# Patient Record
Sex: Female | Born: 1940 | Race: Black or African American | Hispanic: No | Marital: Married | Smoking: Former smoker
Health system: Southern US, Community
[De-identification: ages and names within clinical notes are randomized; demographics above are authoritative.]

## PROBLEM LIST (undated history)

## (undated) DIAGNOSIS — G2581 Restless legs syndrome: Secondary | ICD-10-CM

## (undated) DIAGNOSIS — R42 Dizziness and giddiness: Secondary | ICD-10-CM

## (undated) DIAGNOSIS — I10 Essential (primary) hypertension: Secondary | ICD-10-CM

## (undated) HISTORY — PX: TONSILLECTOMY: SUR1361

## (undated) HISTORY — DX: Restless legs syndrome: G25.81

## (undated) HISTORY — DX: Essential (primary) hypertension: I10

## (undated) HISTORY — DX: Dizziness and giddiness: R42

---

## 1997-11-12 ENCOUNTER — Other Ambulatory Visit: Admission: RE | Admit: 1997-11-12 | Discharge: 1997-11-12 | Payer: Self-pay | Admitting: Obstetrics & Gynecology

## 1998-11-26 ENCOUNTER — Other Ambulatory Visit: Admission: RE | Admit: 1998-11-26 | Discharge: 1998-11-26 | Payer: Self-pay | Admitting: Obstetrics & Gynecology

## 1998-12-18 ENCOUNTER — Other Ambulatory Visit: Admission: RE | Admit: 1998-12-18 | Discharge: 1998-12-18 | Payer: Self-pay | Admitting: Obstetrics & Gynecology

## 1998-12-18 ENCOUNTER — Encounter (INDEPENDENT_AMBULATORY_CARE_PROVIDER_SITE_OTHER): Payer: Self-pay | Admitting: Specialist

## 2000-02-02 ENCOUNTER — Encounter: Payer: Self-pay | Admitting: General Practice

## 2000-02-02 ENCOUNTER — Encounter: Admission: RE | Admit: 2000-02-02 | Discharge: 2000-02-02 | Payer: Self-pay | Admitting: General Practice

## 2000-07-19 ENCOUNTER — Other Ambulatory Visit: Admission: RE | Admit: 2000-07-19 | Discharge: 2000-07-19 | Payer: Self-pay | Admitting: Obstetrics and Gynecology

## 2000-11-29 ENCOUNTER — Ambulatory Visit (HOSPITAL_COMMUNITY): Admission: RE | Admit: 2000-11-29 | Discharge: 2000-11-29 | Payer: Self-pay | Admitting: Gastroenterology

## 2001-02-25 ENCOUNTER — Emergency Department (HOSPITAL_COMMUNITY): Admission: EM | Admit: 2001-02-25 | Discharge: 2001-02-25 | Payer: Self-pay

## 2001-06-15 ENCOUNTER — Encounter: Admission: RE | Admit: 2001-06-15 | Discharge: 2001-06-15 | Payer: Self-pay | Admitting: General Practice

## 2001-06-15 ENCOUNTER — Encounter: Payer: Self-pay | Admitting: General Practice

## 2001-10-16 ENCOUNTER — Emergency Department (HOSPITAL_COMMUNITY): Admission: EM | Admit: 2001-10-16 | Discharge: 2001-10-16 | Payer: Self-pay | Admitting: Emergency Medicine

## 2002-10-09 ENCOUNTER — Other Ambulatory Visit: Admission: RE | Admit: 2002-10-09 | Discharge: 2002-10-09 | Payer: Self-pay | Admitting: Obstetrics and Gynecology

## 2004-07-29 ENCOUNTER — Other Ambulatory Visit: Admission: RE | Admit: 2004-07-29 | Discharge: 2004-07-29 | Payer: Self-pay | Admitting: Obstetrics and Gynecology

## 2004-07-30 ENCOUNTER — Ambulatory Visit (HOSPITAL_COMMUNITY): Admission: RE | Admit: 2004-07-30 | Discharge: 2004-07-30 | Payer: Self-pay | Admitting: Obstetrics and Gynecology

## 2005-10-03 ENCOUNTER — Other Ambulatory Visit: Admission: RE | Admit: 2005-10-03 | Discharge: 2005-10-03 | Payer: Self-pay | Admitting: Obstetrics and Gynecology

## 2005-10-11 ENCOUNTER — Encounter: Admission: RE | Admit: 2005-10-11 | Discharge: 2005-10-11 | Payer: Self-pay | Admitting: Obstetrics and Gynecology

## 2007-10-10 ENCOUNTER — Encounter: Admission: RE | Admit: 2007-10-10 | Discharge: 2007-10-10 | Payer: Self-pay | Admitting: Obstetrics and Gynecology

## 2009-07-09 ENCOUNTER — Other Ambulatory Visit: Admission: RE | Admit: 2009-07-09 | Discharge: 2009-07-09 | Payer: Self-pay | Admitting: Family Medicine

## 2010-05-09 ENCOUNTER — Encounter: Payer: Self-pay | Admitting: Obstetrics and Gynecology

## 2010-09-03 NOTE — Procedures (Signed)
[place]  Patient:    Ashley Rogers, Ashley Rogers                     MRN: [id number] Proc. Date: 11/29/00 Adm. Date:  [id number] Attending:  Charna Elizabeth CC:         Janine Limbo, M.D.   Procedure Report  DATE OF BIRTH:  Jun 20, 1940.  PROCEDURE:  Screening flexible sigmoidoscopy.  ENDOSCOPIST:  Anselmo Rod, M.D.  INSTRUMENT USED:  Olympus video colonoscope.  INDICATION FOR PROCEDURE:  A 70 year old African-American female undergoing screening flexible sigmoidoscopy to rule out colonic polyps.  PREPROCEDURE PREPARATION:  Informed consent was procured from the patient. The patient was fasted for eight hours prior to the procedure and prepped with a bottle of Fleets Phospho-Soda the night prior to the procedure.  PREPROCEDURE PHYSICAL:  VITAL SIGNS:  The patient had stable vital signs.  NECK:  Supple.  CHEST:  Clear to auscultation.  S1, S2 regular.  ABDOMEN:  Soft with normal bowel sounds.  DESCRIPTION OF PROCEDURE:  The patient was placed in the left lateral decubitus position.  No sedation was used.  Once the patient was adequately positioned, the pediatric Olympus colonoscope was advanced from the rectum to 90 cm without difficulty.  Except for a few early left-sided diverticula, no other abnormalities were seen.  The patient tolerated the procedure well without complications.  IMPRESSION:  Few early left-sided diverticula, otherwise normal flexible sigmoidoscopy up to 90 cm.  RECOMMENDATIONS: 1. A high-fiber diet has been recommended for the patient. 2. Repeat colorectal cancer screening is recommended in the next five years    unless the patient were to develop any abnormal symptoms in the interim. DD:  11/29/00 TD:  11/29/00 Job: [id number]

## 2011-03-20 ENCOUNTER — Ambulatory Visit (HOSPITAL_COMMUNITY)
Admission: RE | Admit: 2011-03-20 | Discharge: 2011-03-20 | Disposition: A | Discharge status: Home / Self Care | Payer: Medicare Other | Source: Ambulatory Visit | Attending: Internal Medicine | Admitting: Internal Medicine

## 2011-03-20 ENCOUNTER — Other Ambulatory Visit: Payer: Self-pay | Admitting: Internal Medicine

## 2011-03-20 DIAGNOSIS — R42 Dizziness and giddiness: Secondary | ICD-10-CM | POA: Insufficient documentation

## 2011-03-20 DIAGNOSIS — G9389 Other specified disorders of brain: Secondary | ICD-10-CM | POA: Insufficient documentation

## 2011-03-21 ENCOUNTER — Other Ambulatory Visit: Payer: Self-pay | Admitting: Family Medicine

## 2011-03-21 ENCOUNTER — Other Ambulatory Visit: Payer: Self-pay | Admitting: *Deleted

## 2011-03-21 ENCOUNTER — Ambulatory Visit
Admission: RE | Admit: 2011-03-21 | Discharge: 2011-03-21 | Disposition: A | Discharge status: Home / Self Care | Payer: BC Managed Care – PPO | Source: Ambulatory Visit | Attending: Family Medicine | Admitting: Family Medicine

## 2011-03-21 DIAGNOSIS — R270 Ataxia, unspecified: Secondary | ICD-10-CM

## 2011-03-21 DIAGNOSIS — R42 Dizziness and giddiness: Secondary | ICD-10-CM

## 2011-03-21 MED ORDER — GADOBENATE DIMEGLUMINE 529 MG/ML IV SOLN
15.0000 mL | Freq: Once | INTRAVENOUS | Status: AC | PRN
  Administered 2011-03-21: 15 mL via INTRAVENOUS

## 2012-05-24 ENCOUNTER — Emergency Department (HOSPITAL_COMMUNITY): Payer: BC Managed Care – PPO

## 2012-05-24 ENCOUNTER — Emergency Department (HOSPITAL_COMMUNITY)
Admission: EM | Admit: 2012-05-24 | Discharge: 2012-05-25 | Disposition: A | Discharge status: Home / Self Care | Payer: BC Managed Care – PPO | Attending: Emergency Medicine | Admitting: Emergency Medicine

## 2012-05-24 DIAGNOSIS — Y9389 Activity, other specified: Secondary | ICD-10-CM | POA: Insufficient documentation

## 2012-05-24 DIAGNOSIS — Y92410 Unspecified street and highway as the place of occurrence of the external cause: Secondary | ICD-10-CM | POA: Insufficient documentation

## 2012-05-24 DIAGNOSIS — S298XXA Other specified injuries of thorax, initial encounter: Secondary | ICD-10-CM | POA: Insufficient documentation

## 2012-05-24 DIAGNOSIS — I10 Essential (primary) hypertension: Secondary | ICD-10-CM | POA: Insufficient documentation

## 2012-05-24 MED ORDER — HYDROCODONE-ACETAMINOPHEN 5-325 MG PO TABS: 1.0000 | ORAL_TABLET | Freq: Four times a day (QID) | ORAL | Status: DC | PRN

## 2012-05-24 NOTE — ED Notes (Signed)
Patient transported to X-ray 
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
## 2014-08-18 ENCOUNTER — Other Ambulatory Visit (HOSPITAL_COMMUNITY)
Admission: RE | Admit: 2014-08-18 | Discharge: 2014-08-18 | Disposition: A | Discharge status: Home / Self Care | Payer: BC Managed Care – PPO | Source: Ambulatory Visit | Attending: Family Medicine | Admitting: Family Medicine

## 2014-08-18 ENCOUNTER — Other Ambulatory Visit: Payer: Self-pay | Admitting: Family Medicine

## 2014-08-18 DIAGNOSIS — Z1151 Encounter for screening for human papillomavirus (HPV): Secondary | ICD-10-CM | POA: Diagnosis present

## 2014-08-18 DIAGNOSIS — Z124 Encounter for screening for malignant neoplasm of cervix: Secondary | ICD-10-CM | POA: Insufficient documentation

## 2014-08-20 LAB — CYTOLOGY - PAP

## 2015-02-26 ENCOUNTER — Telehealth: Payer: Self-pay | Admitting: *Deleted

## 2015-02-26 NOTE — Telephone Encounter (Signed)
Pt states she is scheduled for a procedure tomorrow and would like to know the prep, prognosis, and the outcome.  Pt states she decided that depending on the doctor's assessment it may be just a consultation tomorrow, and thanked me for my call.

## 2015-02-27 ENCOUNTER — Encounter: Payer: Self-pay | Admitting: Podiatry

## 2015-02-27 ENCOUNTER — Ambulatory Visit (INDEPENDENT_AMBULATORY_CARE_PROVIDER_SITE_OTHER): Payer: BC Managed Care – PPO | Admitting: Podiatry

## 2015-02-27 VITALS — BP 153/86 | HR 80 | Resp 16

## 2015-02-27 DIAGNOSIS — L600 Ingrowing nail: Secondary | ICD-10-CM | POA: Diagnosis not present

## 2015-02-27 NOTE — Patient Instructions (Signed)

## 2015-02-27 NOTE — Progress Notes (Signed)
   Subjective:    Patient ID: Ashley Rogers, female    DOB: 06/14/1940, 74 y.o.   MRN: [id number]  HPI Comments: "I have a toenail that bothers me"  Patient presents with: Nail Problem: 1st toenail right - thick and discolored, tender at times, hard to cut, starting to curve to the side-tries to keep clipped down.       Review of Systems  Skin:       Change in nails  All other systems reviewed and are negative.      Objective:   Physical Exam        Assessment & Plan:

## 2015-03-01 NOTE — Progress Notes (Signed)
Subjective:     Patient ID: Ashley Rogers, female   DOB: 01/15/1941, 74 y.o.   MRN: [id number]  HPI patient states she's having continued problems with her right big toenail and it is getting increasingly thick increasingly damaged and increasingly painful over the last year   Review of Systems  All other systems reviewed and are negative.      Objective:   Physical Exam  Constitutional: She is oriented to person, place, and time.  Cardiovascular: Intact distal pulses.   Musculoskeletal: Normal range of motion.  Neurological: She is oriented to person, place, and time.  Skin: Skin is warm.  Nursing note and vitals reviewed.  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 I do think given the amount of thickness them out a damaged length of time the problem is been there and pain it would be best removed and permanently. I educated patient on the condition and the treatment and patient wants to do this but cannot do this today. She will be scheduled when it works well for her schedule for nail removal and I did go over risk

## 2015-03-03 ENCOUNTER — Telehealth: Payer: Self-pay | Admitting: *Deleted

## 2015-03-03 NOTE — Telephone Encounter (Signed)
Called patient at [phone] (Cell #) to check to see how they were feeling from their ingrown toenail procedure that was performed on Friday, February 27, 2015. Pt stated, "she was too scared to go through with the procedure and did not have anything done to her nail".

## 2015-03-06 ENCOUNTER — Ambulatory Visit: Payer: BC Managed Care – PPO | Admitting: Podiatry

## 2015-03-20 ENCOUNTER — Ambulatory Visit (INDEPENDENT_AMBULATORY_CARE_PROVIDER_SITE_OTHER): Payer: BC Managed Care – PPO | Admitting: Podiatry

## 2015-03-20 ENCOUNTER — Encounter: Payer: Self-pay | Admitting: Podiatry

## 2015-03-20 VITALS — BP 135/81 | HR 90 | Resp 16

## 2015-03-20 DIAGNOSIS — L600 Ingrowing nail: Secondary | ICD-10-CM | POA: Diagnosis not present

## 2015-03-20 NOTE — Patient Instructions (Signed)

## 2015-03-23 NOTE — Progress Notes (Signed)
Subjective:     Patient ID: Ashley Rogers, female   DOB: 06/14/1940, 74 y.o.   MRN: [id number]  HPI patient states she's ready to have the big toenail removed on her right foot and it's been bothering her a lot and making shoe gear difficult. 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 Explain risk and she wants procedure and I infiltrated 60 mg Xylocaine Marcaine mixture remove the hallux nail exposed matrix and applied phenol 5 applications 30 seconds followed by alcohol lavage and sterile dressing. Gave instructions on soaks and reappoint

## 2016-06-17 ENCOUNTER — Ambulatory Visit (INDEPENDENT_AMBULATORY_CARE_PROVIDER_SITE_OTHER): Payer: BC Managed Care – PPO | Admitting: Podiatry

## 2016-06-17 ENCOUNTER — Ambulatory Visit (INDEPENDENT_AMBULATORY_CARE_PROVIDER_SITE_OTHER): Payer: BC Managed Care – PPO

## 2016-06-17 ENCOUNTER — Encounter: Payer: Self-pay | Admitting: Podiatry

## 2016-06-17 DIAGNOSIS — M779 Enthesopathy, unspecified: Secondary | ICD-10-CM | POA: Diagnosis not present

## 2016-06-17 DIAGNOSIS — M778 Other enthesopathies, not elsewhere classified: Secondary | ICD-10-CM

## 2016-06-17 DIAGNOSIS — M79674 Pain in right toe(s): Secondary | ICD-10-CM

## 2016-06-17 DIAGNOSIS — M7751 Other enthesopathy of right foot: Secondary | ICD-10-CM

## 2016-06-17 MED ORDER — TRIAMCINOLONE ACETONIDE 10 MG/ML IJ SUSP
10.0000 mg | Freq: Once | INTRAMUSCULAR | Status: AC
  Administered 2016-06-17: 10 mg

## 2016-06-19 NOTE — Progress Notes (Signed)
Subjective:     Patient ID: Ashley Rogers, female   DOB: 06/21/1940, 76 y.o.   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 Advised on reduced activity and reappoint to recheck  X-ray report indicated that there is quite a bit of spurring and narrowing of the joint surface first MPJ right

## 2017-11-09 ENCOUNTER — Emergency Department (HOSPITAL_COMMUNITY): Payer: Medicare Other

## 2017-11-09 ENCOUNTER — Emergency Department (HOSPITAL_COMMUNITY)
Admission: EM | Admit: 2017-11-09 | Discharge: 2017-11-09 | Disposition: A | Discharge status: Home / Self Care | Payer: Medicare Other | Attending: Emergency Medicine | Admitting: Emergency Medicine

## 2017-11-09 ENCOUNTER — Encounter (HOSPITAL_COMMUNITY): Payer: Self-pay | Admitting: *Deleted

## 2017-11-09 DIAGNOSIS — R519 Headache, unspecified: Secondary | ICD-10-CM

## 2017-11-09 DIAGNOSIS — Z79899 Other long term (current) drug therapy: Secondary | ICD-10-CM | POA: Insufficient documentation

## 2017-11-09 DIAGNOSIS — R51 Headache: Secondary | ICD-10-CM | POA: Insufficient documentation

## 2017-11-09 DIAGNOSIS — M791 Myalgia, unspecified site: Secondary | ICD-10-CM

## 2017-11-09 LAB — CBC WITH DIFFERENTIAL/PLATELET
Abs Immature Granulocytes: 0 10*3/uL (ref 0.0–0.1)
Basophils Absolute: 0 10*3/uL (ref 0.0–0.1)
Basophils Relative: 0 %
EOS PCT: 0 %
Eosinophils Absolute: 0 10*3/uL (ref 0.0–0.7)
HCT: 42.3 % (ref 36.0–46.0)
Hemoglobin: 14.1 g/dL (ref 12.0–15.0)
IMMATURE GRANULOCYTES: 0 %
LYMPHS PCT: 12 %
Lymphs Abs: 1.6 10*3/uL (ref 0.7–4.0)
MCH: 29.1 pg (ref 26.0–34.0)
MCHC: 33.3 g/dL (ref 30.0–36.0)
MCV: 87.2 fL (ref 78.0–100.0)
Monocytes Absolute: 0.7 10*3/uL (ref 0.1–1.0)
Monocytes Relative: 6 %
NEUTROS PCT: 82 %
Neutro Abs: 10.4 10*3/uL — ABNORMAL HIGH (ref 1.7–7.7)
Platelets: 310 10*3/uL (ref 150–400)
RBC: 4.85 MIL/uL (ref 3.87–5.11)
RDW: 12.3 % (ref 11.5–15.5)
WBC: 12.7 10*3/uL — AB (ref 4.0–10.5)

## 2017-11-09 LAB — I-STAT CHEM 8, ED
BUN: 14 mg/dL (ref 8–23)
Calcium, Ion: 1.12 mmol/L — ABNORMAL LOW (ref 1.15–1.40)
Chloride: 98 mmol/L (ref 98–111)
Creatinine, Ser: 0.7 mg/dL (ref 0.44–1.00)
Glucose, Bld: 93 mg/dL (ref 70–99)
HEMATOCRIT: 44 % (ref 36.0–46.0)
Hemoglobin: 15 g/dL (ref 12.0–15.0)
Potassium: 4 mmol/L (ref 3.5–5.1)
Sodium: 136 mmol/L (ref 135–145)
TCO2: 25 mmol/L (ref 22–32)

## 2017-11-09 LAB — COMPREHENSIVE METABOLIC PANEL
ALBUMIN: 4.2 g/dL (ref 3.5–5.0)
ALK PHOS: 77 U/L (ref 38–126)
ALT: 16 U/L (ref 0–44)
AST: 22 U/L (ref 15–41)
Anion gap: 13 (ref 5–15)
BUN: 12 mg/dL (ref 8–23)
CALCIUM: 9.1 mg/dL (ref 8.9–10.3)
CO2: 23 mmol/L (ref 22–32)
CREATININE: 0.78 mg/dL (ref 0.44–1.00)
Chloride: 99 mmol/L (ref 98–111)
GFR calc Af Amer: >60 mL/min (ref >60)
GFR calc non Af Amer: >60 mL/min (ref >60)
GLUCOSE: 95 mg/dL (ref 70–99)
Potassium: 4 mmol/L (ref 3.5–5.1)
SODIUM: 135 mmol/L (ref 135–145)
Total Bilirubin: 0.8 mg/dL (ref 0.3–1.2)
Total Protein: 8 g/dL (ref 6.5–8.1)

## 2017-11-09 LAB — URINALYSIS, ROUTINE W REFLEX MICROSCOPIC
Bilirubin Urine: NEGATIVE
Glucose, UA: NEGATIVE mg/dL
HGB URINE DIPSTICK: NEGATIVE
Ketones, ur: 20 mg/dL — AB
Leukocytes, UA: NEGATIVE
NITRITE: NEGATIVE
PH: 6 (ref 5.0–8.0)
Protein, ur: 30 mg/dL — AB
Specific Gravity, Urine: 1.018 (ref 1.005–1.030)

## 2017-11-09 LAB — CK: CK TOTAL: 101 U/L (ref 38–234)

## 2017-11-09 LAB — I-STAT CG4 LACTIC ACID, ED: LACTIC ACID, VENOUS: 1.06 mmol/L (ref 0.5–1.9)

## 2017-11-09 MED ORDER — SODIUM CHLORIDE 0.9 % IV BOLUS
500.0000 mL | Freq: Once | INTRAVENOUS | Status: AC
  Administered 2017-11-09: 500 mL via INTRAVENOUS

## 2017-11-09 MED ORDER — METOCLOPRAMIDE HCL 5 MG/ML IJ SOLN
10.0000 mg | Freq: Once | INTRAMUSCULAR | Status: AC
  Administered 2017-11-09: 10 mg via INTRAVENOUS
  Filled 2017-11-09: qty 2

## 2017-11-09 MED ORDER — ACETAMINOPHEN 325 MG PO TABS
650.0000 mg | ORAL_TABLET | Freq: Once | ORAL | Status: AC
Start: 2017-11-09 — End: 2017-11-09
  Administered 2017-11-09: 650 mg via ORAL
  Filled 2017-11-09: qty 2

## 2017-11-09 NOTE — ED Provider Notes (Signed)
[facility] EMERGENCY DEPARTMENT Provider Note   CSN: [id number] Arrival date & time: 11/09/17  1032     History   Chief Complaint Chief Complaint  Patient presents with  . Headache  . Generalized Body Aches    HPI Ashley Rogers is a 77 y.o. female.  The history is provided by the patient. No language interpreter was used.  Headache       77 year old female presenting for evaluation of headache and body aches.  Patient reports she developed gradual onset of headache and body aches which started yesterday afternoon.  States that she hurts all throughout her whole body along with body stiffness.  She described headache as more of a tension headache with throbbing pressure to the top of her head, persistent mildly improved with taking Advil yesterday.  Her body ache has since improved as well.  There is no associated fever, light or sound sensitivity, URI symptoms, sore throat, neck pain, chest pain, productive cough, shortness of breath, abdominal pain, urinary symptoms, nausea vomiting or diarrhea.  History reviewed. No pertinent past medical history.  There are no active problems to display for this patient.   History reviewed. No pertinent surgical history.   OB History   None      Home Medications    Prior to Admission medications   Medication Sig Start Date End Date Taking? Authorizing Provider  amLODipine (NORVASC) 2.5 MG tablet Take 2.5 mg by mouth daily.    [provider]  valsartan (DIOVAN) 320 MG tablet Take 320 mg by mouth daily.    [provider]    Family History History reviewed. No pertinent family history.  Social History Social History   Tobacco Use  . Smoking status: Never Smoker  . Smokeless tobacco: Never Used  Substance Use Topics  . Alcohol use: Yes    Alcohol/week: 0.0 oz  . Drug use: Not on file     Allergies   Codeine   Review of Systems Review of Systems  Neurological: Positive for  headaches.  All other systems reviewed and are negative.    Physical Exam Updated Vital Signs BP (!) 156/77 (BP Location: Right Arm)   Pulse 94   Temp 98.9 F (37.2 C) (Oral)   Resp 16   SpO2 98%   Physical Exam  Constitutional: She is oriented to person, place, and time. She appears well-developed and well-nourished. No distress.  HENT:  Head: Normocephalic and atraumatic.  Mouth/Throat: Oropharynx is clear and moist.  Eyes: Pupils are equal, round, and reactive to light. Conjunctivae and EOM are normal.  Neck: Normal range of motion. Neck supple. No neck rigidity.  Cardiovascular: Normal rate and regular rhythm.  Pulmonary/Chest: Effort normal and breath sounds normal.  Abdominal: Soft. Bowel sounds are normal. There is no tenderness.  Musculoskeletal: She exhibits tenderness (Diffuse tenderness to palpation of body without focal point tenderness.).  Neurological: She is alert and oriented to person, place, and time. She has normal strength. She displays a negative Romberg sign. GCS eye subscore is 4. GCS verbal subscore is 5. GCS motor subscore is 6.  Skin: Skin is warm. No rash noted.  Psychiatric: She has a normal mood and affect.  Nursing note and vitals reviewed.    ED Treatments / Results  Labs (all labs ordered are listed, but only abnormal results are displayed) Labs Reviewed  CBC WITH DIFFERENTIAL/PLATELET - Abnormal; Notable for the following components:      Result Value   WBC  12.7 (*)    Neutro Abs 10.4 (*)    All other components within normal limits  URINALYSIS, ROUTINE W REFLEX MICROSCOPIC - Abnormal; Notable for the following components:   Ketones, ur 20 (*)    Protein, ur 30 (*)    Bacteria, UA RARE (*)    All other components within normal limits  I-STAT CHEM 8, ED - Abnormal; Notable for the following components:   Calcium, Ion 1.12 (*)    All other components within normal limits  COMPREHENSIVE METABOLIC PANEL  CK  I-STAT CG4 LACTIC ACID, ED     EKG None  Radiology Dg Chest 2 View  Result Date: 11/09/2017 CLINICAL DATA:  Aches and pains all over since yesterday. Pt c/o bad headache that feels more like a tension headache than a migraine. Hx of hypertension. Former smoker(40 years ago). EXAM: CHEST - 2 VIEW COMPARISON:  05/24/2012 FINDINGS: Lungs are clear. Heart size and mediastinal contours are within normal limits. No effusion. Visualized bones unremarkable. IMPRESSION: No acute cardiopulmonary disease. Electronically Signed   By: Corlis Leak  Hassell M.D.   On: 11/09/2017 11:48   Ct Head Wo Contrast  Result Date: 11/09/2017 CLINICAL DATA:  Headache. EXAM: CT HEAD WITHOUT CONTRAST TECHNIQUE: Contiguous axial images were obtained from the base of the skull through the vertex without intravenous contrast. COMPARISON:  MR brain dated March 21, 2011. CT head dated March 20, 2011. FINDINGS: Brain: No evidence of acute infarction, hemorrhage, hydrocephalus, extra-axial collection or mass lesion/mass effect. Stable mild atrophy and chronic microvascular ischemic changes. Vascular: Atherosclerotic vascular calcification of the carotid siphons. No hyperdense vessel. Skull: Normal. Negative for fracture or focal lesion. Sinuses/Orbits: No acute finding. Other: None. IMPRESSION: 1.  No acute intracranial abnormality. Electronically Signed   By: Obie DredgeWilliam T Derry M.D.   On: 11/09/2017 11:44    Procedures Procedures (including critical care time)  Medications Ordered in ED Medications  acetaminophen (TYLENOL) tablet 650 mg (650 mg Oral Given 11/09/17 1259)  metoCLOPramide (REGLAN) injection 10 mg (10 mg Intravenous Given 11/09/17 1259)  sodium chloride 0.9 % bolus 500 mL (0 mLs Intravenous Stopped 11/09/17 1437)     Initial Impression / Assessment and Plan / ED Course  I have reviewed the triage vital signs and the nursing notes.  Pertinent labs & imaging results that were available during my care of the patient were reviewed by me and considered  in my medical decision making (see chart for details).     BP (!) 143/66   Pulse 88   Temp 98.9 F (37.2 C) (Oral)   Resp 18   SpO2 99%    Final Clinical Impressions(s) / ED Diagnoses   Final diagnoses:  Bad headache  Myalgia    ED Discharge Orders    None     11:13 AM Patient complaining of headaches and body aches.  This all started since yesterday.  Headache is not acute onset thunderclap headache concerning for subarachnoid hemorrhage.  She has no focal neuro deficit on exam to suggest stroke or space-occupying lesion.  No fever or nuchal rigidity concerning for meningitis.  She is elderly and normally does not have headache therefore I will obtain head CT scan for evaluation.  She endorsed generalized body aches without any obvious infectious symptoms.  Will obtain chest x-ray and UA as well.  No new medication change that can cause rhabdomyolysis. Care discussed with DR. Madilyn Hookees.   1:14 PM White count is mildly elevated at 12.7, chest x-ray and UA without obvious  infectious source.  Normal lactic acid.  We discussed with patient option of lumbar puncture to rule out meningitis or subarachnoid bleed however patient declined.  Patient is alert and oriented and able to make informed decision.  She understand the risks which may include worsening of her condition and potential death.  2:40 PM Pt currently receiving migraine cocktail.  Total CK is pending.   3:42 PM Normal total CK.  Patient felt better.  Will discharge home with close follow-up with PCP.  Patient understands to return promptly if her symptoms worsen especially if she developed fever or worsening headache.  She will likely benefit from a lumbar puncture at that time.   Fayrene Helper, PA-C 11/09/17 1552    Tilden Fossa, MD 11/15/17 [phone]

## 2017-11-09 NOTE — ED Notes (Signed)
Lab contacted about CK levels.  Running lab now.

## 2017-11-09 NOTE — ED Notes (Signed)
Pt resting.

## 2017-11-09 NOTE — ED Notes (Signed)
Patient returned from CT

## 2017-11-09 NOTE — Discharge Instructions (Signed)
Please follow up closely with your doctor for further evaluation of your headache.  If you developed fever, confusion, worsening headache or if you have other concerns do hesitate to return to the ER for further management.

## 2017-11-09 NOTE — ED Notes (Signed)
Patient transported to CT 

## 2017-11-09 NOTE — ED Triage Notes (Signed)
Pt in c/o body aches and stiffness that started yesterday and then developed a headache, patient states the headache is not normal for her, no distress noted

## 2018-05-19 NOTE — Progress Notes (Signed)
[facility] Clinic Note - Initial Visit   Date: 05/21/18  Ashley Rogers MRN: [id number] DOB: [birth year]   Dear Dr. Docia Chuck:  Thank you for your kind referral of Ashley Rogers for consultation of right arm numbness. Although her history is well known to you, please allow [place] to reiterate it for the purpose of our medical record. The patient was accompanied to the clinic by self.    History of Present Illness: Ashley Rogers is a 78 y.o. right-handed African American female with hypertension, restless leg syndrome, and urinary urgency presenting for evaluation of right arm numbness.    Starting in December 2019, she began having intermittent numbness and tingling which travels down her right upper arm, forearm, and into the fingers.  Symptoms are intermittent, without any specific triggers, such as neck position or activity.  She has constant numbness over the tips of the right index, middle, and ring finger.  She denies any weakness in the arm strength or grip.  She has chronic neck stiffness.  She was offered prednisone by her PCP, with no improvement.  She denies similar symptoms of the left arm.    Past Medical History:  Diagnosis Date  . Hypertension   . RLS (restless legs syndrome)     Past Surgical History:  Procedure Laterality Date  . TONSILLECTOMY       Medications:  Outpatient Encounter Medications as of 05/21/2018  Medication Sig  . amLODipine (NORVASC) 2.5 MG tablet Take 2.5 mg by mouth daily.  Marland Kitchen ibuprofen (ADVIL,MOTRIN) 200 MG tablet Take 800 mg by mouth every 6 (six) hours as needed for moderate pain.  Marland Kitchen olmesartan (BENICAR) 40 MG tablet Take 40 mg by mouth daily.  Marland Kitchen oxybutynin (DITROPAN-XL) 10 MG 24 hr tablet Take 10 mg by mouth daily.   No facility-administered encounter medications on file as of 05/21/2018.      Allergies:  Allergies  Allergen Reactions  . Codeine Nausea And Vomiting    Family History: Family History    Problem Relation Age of Onset  . Hypertension Mother   . Dementia Mother   . Hypertension Father   . Heart disease Father   . Cervical cancer Sister     Social History: Social History   Tobacco Use  . Smoking status: Former Smoker    Types: Cigarettes    Last attempt to quit: 1995    Years since quitting: 25.1  . Smokeless tobacco: Never Used  Substance Use Topics  . Alcohol use: Yes    Alcohol/week: 0.0 standard drinks    Comment: Wine - 1 glass nightly  . Drug use: Not on file   Social History   Social History Narrative   She lives with husband and grandson.  She had one grown daughter.    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   EYES: No visual changes or eye pain ENT: No hearing changes.  No history of nose bleeds.   RESPIRATORY: No cough, wheezing and shortness of breath.   CARDIOVASCULAR: Negative for chest pain, and palpitations.   GI: Negative for abdominal discomfort, blood in stools or black stools.  No recent change in bowel habits.   GU:  No history of incontinence.   MUSCLOSKELETAL: No history of joint pain or swelling.  No myalgias.   SKIN:  Negative for lesions, rash, and itching.   HEMATOLOGY/ONCOLOGY: Negative for prolonged bleeding, bruising easily, and swollen nodes.  No history of cancer.   ENDOCRINE: Negative for cold or heat intolerance, polydipsia or goiter.   PSYCH:  No depression or anxiety symptoms.   NEURO: As Above.   Vital Signs:  BP 110/70   Pulse 80   Ht 5\' 5"  (1.651 m)   Wt 157 lb (71.2 kg)   SpO2 98%   BMI 26.13 kg/m    General Medical Exam:   General:  Well appearing, comfortable.   Eyes/ENT: see cranial nerve examination.   Neck: No masses appreciated.  Full range of motion without tenderness.  No carotid bruits. Respiratory:  Clear to auscultation, good air  entry bilaterally.   Cardiac:  Regular rate and rhythm, no murmur.   Extremities:  No deformities, edema, or skin discoloration.  Skin:  No rashes or lesions.  Neurological Exam: MENTAL STATUS including orientation to time, place, person, recent and remote memory, attention span and concentration, language, and fund of knowledge is normal.  Speech is not dysarthric.  CRANIAL NERVES: II:  No visual field defects.  Unremarkable fundi.   III-IV-VI: Pupils equal round and reactive to light.  Normal conjugate, extra-ocular eye movements in all directions of gaze.  No nystagmus.  No ptosis.   V:  Normal facial sensation.     VII:  Normal facial symmetry and movements.    VIII:  Normal hearing and vestibular function.   IX-X:  Normal palatal movement.   XI:  Normal shoulder shrug and head rotation.   XII:  Normal tongue strength and range of motion, no deviation or fasciculation.  MOTOR:  No atrophy, fasciculations or abnormal movements.  No pronator drift.  Tone is normal.    Right Upper Extremity:    Left Upper Extremity:    Deltoid  5/5   Deltoid  5/5   Biceps  5/5   Biceps  5/5   Triceps  5/5   Triceps  5/5   Wrist extensors  5/5   Wrist extensors  5/5   Wrist flexors  5/5   Wrist flexors  5/5   Finger extensors  5/5   Finger extensors  5/5   Finger flexors  5/5   Finger flexors  5/5   Dorsal interossei  5/5   Dorsal interossei  5/5   Abductor pollicis  5/5   Abductor pollicis  5/5   Tone (Ashworth scale)  0  Tone (Ashworth scale)  0   Right Lower Extremity:    Left Lower Extremity:    Hip flexors  5/5   Hip flexors  5/5   Hip extensors  5/5   Hip extensors  5/5   Knee flexors  5/5   Knee flexors  5/5   Knee extensors  5/5   Knee extensors  5/5   Dorsiflexors  5/5   Dorsiflexors  5/5   Plantarflexors  5/5   Plantarflexors  5/5   Toe extensors  5/5   Toe extensors  5/5   Toe flexors  5/5   Toe flexors  5/5   Tone (Ashworth scale)  0  Tone (Ashworth scale)  0   MSRs:  Right  Left brachioradialis 2+  brachioradialis 2+  biceps 2+  biceps 2+  triceps 2+  triceps 2+  patellar 2+  patellar 2+  ankle jerk 2+  ankle jerk 2+  Hoffman no  Hoffman no  plantar response down  plantar response down   SENSORY:  Normal and symmetric perception of light touch, pinprick, vibration, and proprioception.Marland Kitchen   COORDINATION/GAIT: Normal finger-to- nose-finger and heel-to-shin.  Intact rapid alternating movements bilaterally.Gait narrow based and stable.    IMPRESSION: Right arm paresthesias, most likely due to cervical radiculopathy, however cannot exclude superimposed carpal tunnel syndrome.    - NCS/EMG of the right arm to better localize symptoms.  I will give further recommendations based on these results.  - Referral for neck physiotherapy  Return to clinic in 4 months  Thank you for allowing me to participate in patient's care.  If I can answer any additional questions, I would be pleased to do so.    Sincerely,     K. Allena Katz, DO

## 2018-05-21 ENCOUNTER — Ambulatory Visit (INDEPENDENT_AMBULATORY_CARE_PROVIDER_SITE_OTHER): Payer: Medicare Other | Admitting: Neurology

## 2018-05-21 ENCOUNTER — Encounter: Payer: Self-pay | Admitting: Neurology

## 2018-05-21 VITALS — BP 110/70 | HR 80 | Ht 65.0 in | Wt 157.0 lb

## 2018-05-21 DIAGNOSIS — M5412 Radiculopathy, cervical region: Secondary | ICD-10-CM

## 2018-05-21 DIAGNOSIS — R202 Paresthesia of skin: Secondary | ICD-10-CM | POA: Diagnosis not present

## 2018-05-21 NOTE — Patient Instructions (Signed)
Nerve testing of the right arm.  Please do not apply lotion.  Start neck physiotherapy  Return to clinic 4 months  The physicians and staff at [facility] are committed to providing excellent care. You may receive a survey requesting feedback about your experience at our office. We strive to receive "very good" responses to the survey questions. If you feel that your experience would prevent you from giving the office a "very good " response, please contact our office to try to remedy the situation. We may be reached at [phone]. Thank you for taking the time out of your busy day to complete the survey.

## 2018-05-24 ENCOUNTER — Ambulatory Visit (INDEPENDENT_AMBULATORY_CARE_PROVIDER_SITE_OTHER): Payer: Medicare Other | Admitting: Neurology

## 2018-05-24 DIAGNOSIS — R202 Paresthesia of skin: Secondary | ICD-10-CM

## 2018-05-24 DIAGNOSIS — M5412 Radiculopathy, cervical region: Secondary | ICD-10-CM | POA: Diagnosis not present

## 2018-05-24 DIAGNOSIS — G5601 Carpal tunnel syndrome, right upper limb: Secondary | ICD-10-CM | POA: Diagnosis not present

## 2018-05-24 NOTE — Procedures (Signed)
[facility]  [address], [address]  [place], [place] [address] Tel: [phone] Fax:  [phone] Test Date:  05/24/2018  Patient: Ashley Rogers DOB: Feb 08, 1941 Physician: Nita Sickle, DO  Sex: Female Height: 5\' 5"  Ref Phys: Nita Sickle, DO  ID#: [id number] Temp: 33.0C Technician:    Patient Complaints: This is a 78 year-old female referred for evaluation of right arm radicular paresthesias.  NCV & EMG Findings: Extensive electrodiagnostic testing of the right upper extremity shows:  1. Right median sensory response shows prolonged distal peak latency (6.4 ms).  Right ulnar sensory responses within normal limits. 2. Right median motor response shows prolonged distal onset latency (5.9 ms).  Right ulnar motor responses within normal limits.   3. Chronic motor axonal loss changes are seen affecting the pronator teres and biceps muscles, without accompanied active denervation.    Impression: 1. Right median neuropathy at or distal to the wrist, consistent with a clinical diagnosis of carpal tunnel syndrome.  Overall, these findings are at least moderate in degree electrically. 2. Chronic C6 radiculopathy affecting the right upper extremity, mild in degree electrically.   ___________________________ Nita Sickle, DO    Nerve Conduction Studies Anti Sensory Summary Table   Stim Site NR Peak (ms) Norm Peak (ms) P-T Amp (V) Norm P-T Amp  Right Median Anti Sensory (2nd Digit)  33C  Wrist    6.4 <3.8 10.2 >10  Right Ulnar Anti Sensory (5th Digit)  33C  Wrist    3.1 <3.2 20.8 >5   Motor Summary Table   Stim Site NR Onset (ms) Norm Onset (ms) O-P Amp (mV) Norm O-P Amp Site1 Site2 Delta-0 (ms) Dist (cm) Vel (m/s) Norm Vel (m/s)  Right Median Motor (Abd Poll Brev)  33C  Wrist    5.9 <4.0 6.9 >5 Elbow Wrist 5.3 29.0 55 >50  Elbow    11.2  6.7         Right Ulnar Motor (Abd Dig Minimi)  33C  Wrist    2.3 <3.1 9.0 >7 B Elbow Wrist 4.1 23.0 56 >50  B Elbow    6.4   9.0  A Elbow B Elbow 1.8 10.0 56 >50  A Elbow    8.2  8.5          EMG   Side Muscle Ins Act Fibs Psw Fasc Number Recrt Dur Dur. Amp Amp. Poly Poly. Comment  Right 1stDorInt Nml Nml Nml Nml Nml Nml Nml Nml Nml Nml Nml Nml N/A  Right PronatorTeres Nml Nml Nml Nml 1- Rapid Some 1+ Few 1+ Nml Nml N/A  Right Biceps Nml Nml Nml Nml 1- Rapid Some 1+ Few 1+ Nml Nml N/A  Right Triceps Nml Nml Nml Nml Nml Nml Nml Nml Nml Nml Nml Nml N/A  Right Deltoid Nml Nml Nml Nml Nml Nml Nml Nml Nml Nml Nml Nml N/A  Right Abd Poll Brev Nml Nml Nml Nml Nml Nml Nml Nml Nml Nml Nml Nml N/A      Waveforms:

## 2018-05-24 NOTE — Progress Notes (Signed)
    Follow-up Visit   Date: 05/24/18    Ashley NounFrancine M Kluttz MRN: [id number] DOB: 05/14/1940   Interim History: Ashley Rogers is a 78 y.o. right-handed African American female with hypertension, restless leg syndrome, and urinary urgency returning to the clinic for follow-up of right hand tingling.  The patient was accompanied to the clinic by self.  History of present illness: Starting in December 2019, she began having intermittent numbness and tingling which travels down her right upper arm, forearm, and into the fingers.  Symptoms are intermittent, without any specific triggers, such as neck position or activity.  She has constant numbness over the tips of the right index, middle, and ring finger.  She denies any weakness in the arm strength or grip.  She has chronic neck stiffness.  She was offered prednisone by her PCP, with no improvement.  She denies similar symptoms of the left arm.   UPDATE 05/24/2018:  She is here for electrodiagnostic testing of the right hand and discuss results.  Symptoms remain unchanged as noted above.   Medications:  Current Outpatient Medications on File Prior to Visit  Medication Sig Dispense Refill  . amLODipine (NORVASC) 2.5 MG tablet Take 2.5 mg by mouth daily.    Marland Kitchen. ibuprofen (ADVIL,MOTRIN) 200 MG tablet Take 800 mg by mouth every 6 (six) hours as needed for moderate pain.    Marland Kitchen. olmesartan (BENICAR) 40 MG tablet Take 40 mg by mouth daily.    Marland Kitchen. oxybutynin (DITROPAN-XL) 10 MG 24 hr tablet Take 10 mg by mouth daily.     No current facility-administered medications on file prior to visit.     Allergies:  Allergies  Allergen Reactions  . Codeine Nausea And Vomiting     Vital Signs:  There were no vitals taken for this visit.  Neurological Exam: MENTAL STATUS including orientation to time, place, person, recent and remote memory, attention span and concentration, language, and fund of knowledge is normal.  Speech is not dysarthric.  CRANIAL  NERVES: Face is symmetric.   MOTOR:  Motor strength is 5/5 in all extremities.  No atrophy, fasciculations or abnormal movements.  No pronator drift.  Tone is normal.    COORDINATION/GAIT:  Gait narrow based and stable.   Data: NCS/EMG of the right arm 05/24/2018: 1. Right median neuropathy at or distal to the wrist, consistent with a clinical diagnosis of carpal tunnel syndrome.  Overall, these findings are at least moderate in degree electrically. 2. Chronic C6 radiculopathy affecting the right upper extremity, mild in degree electrically.  IMPRESSION/PLAN Right carpal tunnel syndrome (moderate)     - Recommend using a wrist splint  - strategies to minimize nerve impingement discussed Right C6 cervical radiculopathy (mild)  - Start neck physiotherapy  Return to clinic in 3 months   Thank you for allowing me to participate in patient's care.  If I can answer any additional questions, I would be pleased to do so.    Sincerely,    Kal Chait K. Allena KatzPatel, DO

## 2018-09-13 ENCOUNTER — Other Ambulatory Visit: Payer: Self-pay

## 2018-09-13 ENCOUNTER — Ambulatory Visit (HOSPITAL_COMMUNITY)
Admission: EM | Admit: 2018-09-13 | Discharge: 2018-09-13 | Disposition: A | Discharge status: Home / Self Care | Payer: Medicare Other | Attending: Internal Medicine | Admitting: Internal Medicine

## 2018-09-13 ENCOUNTER — Encounter (HOSPITAL_COMMUNITY): Payer: Self-pay | Admitting: Emergency Medicine

## 2018-09-13 DIAGNOSIS — I10 Essential (primary) hypertension: Secondary | ICD-10-CM | POA: Diagnosis not present

## 2018-09-13 DIAGNOSIS — R42 Dizziness and giddiness: Secondary | ICD-10-CM

## 2018-09-13 MED ORDER — ONDANSETRON 4 MG PO TBDP: 4.0000 mg | ORAL_TABLET | Freq: Three times a day (TID) | ORAL | 0 refills | Status: DC | PRN

## 2018-09-13 MED ORDER — MECLIZINE HCL 25 MG PO TABS: 25.0000 mg | ORAL_TABLET | Freq: Three times a day (TID) | ORAL | 0 refills | Status: DC | PRN

## 2018-09-13 NOTE — ED Triage Notes (Signed)
Dizziness and vomiting that started around 5pm today.  Denies pain

## 2018-09-16 NOTE — ED Provider Notes (Signed)
[facility] VIDEO VISITS    CSN: [id number] Arrival date & time: 09/13/18  1951     History   Chief Complaint Chief Complaint  Patient presents with  . Dizziness    HPI Ashley Rogers is a 78 y.o. female with a history of hypertension comes to urgent care with complaints of sudden onset dizziness with a feeling of spinning in the room.  Patient said her symptoms started abruptly fairly soon after she had dinner.  There were no relieving factors at that time.  Symptoms subsided spontaneously.  Patient had some dizziness, nausea and vomiting during the episode.  She denies any upper respiratory infection symptoms.  No fever or chills.  No ringing in the ears.  No earaches or ear discharge.  No changes in the patient's medications.   HPI  Past Medical History:  Diagnosis Date  . Hypertension   . RLS (restless legs syndrome)     There are no active problems to display for this patient.   Past Surgical History:  Procedure Laterality Date  . TONSILLECTOMY      OB History   No obstetric history on file.      Home Medications    Prior to Admission medications   Medication Sig Start Date End Date Taking? Authorizing Provider  amLODipine (NORVASC) 2.5 MG tablet Take 2.5 mg by mouth daily.   Yes [provider]  NON FORMULARY Taking an over the counter potassium tablet   Yes [provider]  olmesartan (BENICAR) 40 MG tablet Take 40 mg by mouth daily.   Yes [provider]  ibuprofen (ADVIL,MOTRIN) 200 MG tablet Take 800 mg by mouth every 6 (six) hours as needed for moderate pain.    [provider]  meclizine (ANTIVERT) 25 MG tablet Take 1 tablet (25 mg total) by mouth 3 (three) times daily as needed for dizziness. 09/13/18   Merrilee JanskyLamptey, Philip O, MD  ondansetron (ZOFRAN ODT) 4 MG disintegrating tablet Take 1 tablet (4 mg total) by mouth every 8 (eight) hours as needed for nausea or vomiting. 09/13/18   Lamptey, Britta MccreedyPhilip O, MD  oxybutynin  (DITROPAN-XL) 10 MG 24 hr tablet Take 10 mg by mouth daily.    [provider]    Family History Family History  Problem Relation Age of Onset  . Hypertension Mother   . Dementia Mother   . Hypertension Father   . Heart disease Father   . Cervical cancer Sister     Social History Social History   Tobacco Use  . Smoking status: Former Smoker    Types: Cigarettes    Last attempt to quit: 1995    Years since quitting: 25.4  . Smokeless tobacco: Never Used  Substance Use Topics  . Alcohol use: Yes    Alcohol/week: 0.0 standard drinks    Comment: Wine - 1 glass nightly  . Drug use: Not on file     Allergies   Codeine   Review of Systems Review of Systems  Constitutional: Negative for activity change, appetite change, chills, fatigue and fever.  HENT: Negative for ear discharge, ear pain, rhinorrhea, sinus pressure, sinus pain, sneezing, sore throat, tinnitus and voice change.   Eyes: Negative for pain, redness and visual disturbance.  Respiratory: Negative for cough, chest tightness and wheezing.   Gastrointestinal: Negative for abdominal pain and diarrhea.  Neurological: Positive for dizziness and light-headedness. Negative for syncope, facial asymmetry, weakness and numbness.  Psychiatric/Behavioral: Negative for agitation, confusion and decreased concentration.  Physical Exam Triage Vital Signs ED Triage Vitals  Enc Vitals Group     BP 09/13/18 2016 (!) 166/93     Pulse Rate 09/13/18 2016 72     Resp 09/13/18 2016 16     Temp 09/13/18 2016 98.4 F (36.9 C)     Temp Source 09/13/18 2016 Oral     SpO2 09/13/18 2016 100 %     Weight --      Height --      Head Circumference --      Peak Flow --      Pain Score 09/13/18 2013 3     Pain Loc --      Pain Edu? --      Excl. in GC? --    No data found.  Updated Vital Signs BP (!) 166/93 (BP Location: Right Arm)   Pulse 72   Temp 98.4 F (36.9 C) (Oral)   Resp 16   SpO2 100%   Visual Acuity  Right Eye Distance:   Left Eye Distance:   Bilateral Distance:    Right Eye Near:   Left Eye Near:    Bilateral Near:     Physical Exam Constitutional:      General: She is not in acute distress.    Appearance: Normal appearance. She is not ill-appearing.  HENT:     Right Ear: Tympanic membrane normal.     Left Ear: Tympanic membrane normal.     Mouth/Throat:     Mouth: Mucous membranes are moist.  Eyes:     Conjunctiva/sclera: Conjunctivae normal.  Cardiovascular:     Rate and Rhythm: Normal rate and regular rhythm.  Abdominal:     General: Bowel sounds are normal.     Palpations: Abdomen is soft.  Skin:    General: Skin is warm.     Capillary Refill: Capillary refill takes less than 2 seconds.  Neurological:     General: No focal deficit present.     Mental Status: She is alert and oriented to person, place, and time.     Cranial Nerves: No cranial nerve deficit.     Sensory: No sensory deficit.     Motor: No weakness.     Coordination: Coordination normal.     Gait: Gait normal.      UC Treatments / Results  Labs (all labs ordered are listed, but only abnormal results are displayed) Labs Reviewed - No data to display  EKG None  Radiology No results found.  Procedures Procedures (including critical care time)  Medications Ordered in UC Medications - No data to display  Initial Impression / Assessment and Plan / UC Course  I have reviewed the triage vital signs and the nursing notes.  Pertinent labs & imaging results that were available during my care of the patient were reviewed by me and considered in my medical decision making (see chart for details).     1.  Benign paroxysmal vertigo: Meclizine as needed for vertigo Zofran as needed for nausea Patient has no focal neurologic deficit and the inciting agent for this episode of vertigo is unclear.  Patient is advised to return to urgent care if vertigo is persistent or if patient has any new  symptoms. Final Clinical Impressions(s) / UC Diagnoses   Final diagnoses:  Vertigo   Discharge Instructions   None    ED Prescriptions    Medication Sig Dispense Auth. Provider   ondansetron (ZOFRAN ODT) 4 MG disintegrating tablet Take 1  tablet (4 mg total) by mouth every 8 (eight) hours as needed for nausea or vomiting. 20 tablet Lamptey, Britta Mccreedy, MD   meclizine (ANTIVERT) 25 MG tablet Take 1 tablet (25 mg total) by mouth 3 (three) times daily as needed for dizziness. 30 tablet Lamptey, Britta Mccreedy, MD     Controlled Substance Prescriptions [place] Controlled Substance Registry consulted? Not Applicable   Merrilee Jansky, MD 09/16/18 1045

## 2018-09-24 ENCOUNTER — Ambulatory Visit: Payer: Medicare Other | Admitting: Neurology

## 2018-09-27 NOTE — Progress Notes (Signed)
No show

## 2018-09-28 ENCOUNTER — Telehealth (INDEPENDENT_AMBULATORY_CARE_PROVIDER_SITE_OTHER): Payer: Medicare Other | Admitting: Neurology

## 2018-09-28 ENCOUNTER — Encounter: Payer: Self-pay | Admitting: Neurology

## 2018-09-28 ENCOUNTER — Other Ambulatory Visit: Payer: Self-pay

## 2018-10-24 ENCOUNTER — Other Ambulatory Visit: Payer: Self-pay

## 2018-10-24 ENCOUNTER — Encounter: Payer: Self-pay | Admitting: Neurology

## 2018-10-24 ENCOUNTER — Telehealth (INDEPENDENT_AMBULATORY_CARE_PROVIDER_SITE_OTHER): Payer: Medicare Other | Admitting: Neurology

## 2018-10-24 VITALS — Ht 65.0 in | Wt 157.0 lb

## 2018-10-24 DIAGNOSIS — M5412 Radiculopathy, cervical region: Secondary | ICD-10-CM | POA: Diagnosis not present

## 2018-10-24 DIAGNOSIS — G5601 Carpal tunnel syndrome, right upper limb: Secondary | ICD-10-CM | POA: Diagnosis not present

## 2018-10-24 NOTE — Progress Notes (Signed)
Appointment scheduled.

## 2018-10-24 NOTE — Progress Notes (Signed)
   Virtual Visit via Video Note The purpose of this virtual visit is to provide medical care while limiting exposure to the novel coronavirus.    Consent was obtained for video visit:  Yes.   Answered questions that patient had about telehealth interaction:  Yes.   I discussed the limitations, risks, security and privacy concerns of performing an evaluation and management service by telemedicine. I also discussed with the patient that there may be a patient responsible charge related to this service. The patient expressed understanding and agreed to proceed.  Pt location: Home Physician Location: office Name of referring provider:  Lujean Amel, MD I connected with Lendon Colonel at patients initiation/request on 10/24/2018 at 10:30 AM EDT by video enabled telemedicine application and verified that I am speaking with the correct person using two identifiers. Pt MRN:  [id number] Pt DOB:  02/23/1941 Video Participants:  Lendon Colonel   History of Present Illness: This is a 78 y.o. female returning for follow-up of right carpal tunnel syndrome.  She has completed physical therapy and feels that the numbness in the right hand is not as pronounced.  She continues to have some shoulder pain.  She denies any radicular right arm pain.  Overall, she feels that symptoms are stable with no significant improvement or progression.  She is interested in playing tennis and is wondering if this would exacerbate her symptoms any..  Observations/Objective:   Vitals:   10/24/18 0956  Weight: 157 lb (71.2 kg)  Height: 5\' 5"  (1.651 m)  BP 134/85   HR 79  Patient is awake, alert, and appears comfortable.  Oriented x 4.   Face is symmetric.  Speech is not dysarthric. Tongue is midline. Antigravity in all extremities.  No pronator drift.   Assessment and Plan:  Right carpal tunnel syndrome (moderate), stable.  - She has competed OT  - Continue to use a wrist splint - compliance encouraged  - If  symptoms progress, she will need to see a hand specialist for injection; she is not interested in surgery  -Continue home exercising, she may resume playing tennis   Follow Up Instructions:   I discussed the assessment and treatment plan with the patient. The patient was provided an opportunity to ask questions and all were answered. The patient agreed with the plan and demonstrated an understanding of the instructions.   The patient was advised to call back or seek an in-person evaluation if the symptoms worsen or if the condition fails to improve as anticipated.  Follow-up in 6 months  Total time spent:  20 minutes     Alda Berthold, DO

## 2018-12-10 ENCOUNTER — Other Ambulatory Visit: Payer: Self-pay

## 2018-12-10 DIAGNOSIS — Z20822 Contact with and (suspected) exposure to covid-19: Secondary | ICD-10-CM

## 2018-12-11 LAB — NOVEL CORONAVIRUS, NAA: SARS-CoV-2, NAA: NOT DETECTED

## 2019-02-21 ENCOUNTER — Other Ambulatory Visit: Payer: Self-pay

## 2019-02-21 DIAGNOSIS — Z20822 Contact with and (suspected) exposure to covid-19: Secondary | ICD-10-CM

## 2019-02-23 LAB — NOVEL CORONAVIRUS, NAA: SARS-CoV-2, NAA: NOT DETECTED

## 2019-04-25 ENCOUNTER — Encounter: Payer: Self-pay | Admitting: Neurology

## 2019-04-26 ENCOUNTER — Telehealth (INDEPENDENT_AMBULATORY_CARE_PROVIDER_SITE_OTHER): Payer: Medicare PPO | Admitting: Neurology

## 2019-04-26 ENCOUNTER — Other Ambulatory Visit: Payer: Self-pay

## 2019-04-26 VITALS — Ht 65.0 in | Wt 160.0 lb

## 2019-04-26 DIAGNOSIS — R252 Cramp and spasm: Secondary | ICD-10-CM

## 2019-04-26 DIAGNOSIS — G5601 Carpal tunnel syndrome, right upper limb: Secondary | ICD-10-CM

## 2019-04-26 NOTE — Progress Notes (Signed)
   Virtual Visit via Video Note The purpose of this virtual visit is to provide medical care while limiting exposure to the novel coronavirus.    Consent was obtained for video visit:  Yes.   Answered questions that patient had about telehealth interaction:  Yes.   I discussed the limitations, risks, security and privacy concerns of performing an evaluation and management service by telemedicine. I also discussed with the patient that there may be a patient responsible charge related to this service. The patient expressed understanding and agreed to proceed.  Pt location: Home Physician Location: office Name of referring provider:  Darrow Bussing, MD I connected with Carmelina Noun at patients initiation/request on 04/26/2019 at 10:30 AM EST by video enabled telemedicine application and verified that I am speaking with the correct person using two identifiers. Pt MRN:  [id number] Pt DOB:  1940-12-25 Video Participants:  Carmelina Noun   History of Present Illness: This is a 79 y.o. female returning for follow-up of right carpal tunnel syndrome.  She has completed physical therapy.  Numbness and tingling of the right hand is not very bothersome or apparent.  Occassionally, she has cramps in the right hand.  No hand weakness or ongoing tingling.  She also has knee pain and shoulder pain for which she sees [facility] who recommended PT.  Now that she has completed PT, she will be having personal training session to remain active.    Observations/Objective:   Vitals:   04/25/19 1142  Weight: 160 lb (72.6 kg)  Height: 5\' 5"  (1.651 m)   Patient is awake, alert, and appears comfortable.   Face is symmetric, speech is clear.   Antigravity in all extremities   Assessment and Plan:  1.  Right carpal tunnel syndrome (moderate), symptoms are not as bothersome  - Encouraged her to use wrist splint at night time  - If symptoms progress, refer to for CTS  release  2.  Muscle cramps  - She admits to not drinking adequate water, so I have suggested ways to help increase water intake   Follow Up Instructions:   I discussed the assessment and treatment plan with the patient. The patient was provided an opportunity to ask questions and all were answered. The patient agreed with the plan and demonstrated an understanding of the instructions.   The patient was advised to call back or seek an in-person evaluation if the symptoms worsen or if the condition fails to improve as anticipated.  Return to clinic as needed   [facility], DO

## 2020-01-07 DIAGNOSIS — Z23 Encounter for immunization: Secondary | ICD-10-CM | POA: Diagnosis not present

## 2020-03-05 DIAGNOSIS — H524 Presbyopia: Secondary | ICD-10-CM | POA: Diagnosis not present

## 2020-03-05 DIAGNOSIS — H52203 Unspecified astigmatism, bilateral: Secondary | ICD-10-CM | POA: Diagnosis not present

## 2020-03-05 DIAGNOSIS — H25813 Combined forms of age-related cataract, bilateral: Secondary | ICD-10-CM | POA: Diagnosis not present

## 2020-03-05 DIAGNOSIS — H5213 Myopia, bilateral: Secondary | ICD-10-CM | POA: Diagnosis not present

## 2020-11-04 DIAGNOSIS — E2839 Other primary ovarian failure: Secondary | ICD-10-CM | POA: Diagnosis not present

## 2020-11-04 DIAGNOSIS — Z79899 Other long term (current) drug therapy: Secondary | ICD-10-CM | POA: Diagnosis not present

## 2020-11-04 DIAGNOSIS — N3941 Urge incontinence: Secondary | ICD-10-CM | POA: Diagnosis not present

## 2020-11-04 DIAGNOSIS — H9193 Unspecified hearing loss, bilateral: Secondary | ICD-10-CM | POA: Diagnosis not present

## 2020-11-04 DIAGNOSIS — Z0001 Encounter for general adult medical examination with abnormal findings: Secondary | ICD-10-CM | POA: Diagnosis not present

## 2020-11-04 DIAGNOSIS — I10 Essential (primary) hypertension: Secondary | ICD-10-CM | POA: Diagnosis not present

## 2020-11-06 ENCOUNTER — Other Ambulatory Visit: Payer: Self-pay | Admitting: Family Medicine

## 2020-11-06 DIAGNOSIS — E2839 Other primary ovarian failure: Secondary | ICD-10-CM

## 2020-11-11 ENCOUNTER — Other Ambulatory Visit: Payer: Medicare PPO

## 2020-11-19 DIAGNOSIS — H903 Sensorineural hearing loss, bilateral: Secondary | ICD-10-CM | POA: Diagnosis not present

## 2021-01-06 ENCOUNTER — Other Ambulatory Visit: Payer: Self-pay

## 2021-01-06 ENCOUNTER — Ambulatory Visit
Admission: RE | Admit: 2021-01-06 | Discharge: 2021-01-06 | Disposition: A | Discharge status: Home / Self Care | Payer: Medicare PPO | Source: Ambulatory Visit | Attending: Family Medicine | Admitting: Family Medicine

## 2021-01-06 DIAGNOSIS — E2839 Other primary ovarian failure: Secondary | ICD-10-CM

## 2021-01-06 DIAGNOSIS — Z780 Asymptomatic menopausal state: Secondary | ICD-10-CM | POA: Diagnosis not present

## 2021-01-07 DIAGNOSIS — Z23 Encounter for immunization: Secondary | ICD-10-CM | POA: Diagnosis not present

## 2021-02-10 DIAGNOSIS — Z8249 Family history of ischemic heart disease and other diseases of the circulatory system: Secondary | ICD-10-CM | POA: Diagnosis not present

## 2021-02-10 DIAGNOSIS — Z885 Allergy status to narcotic agent status: Secondary | ICD-10-CM | POA: Diagnosis not present

## 2021-02-10 DIAGNOSIS — Z87891 Personal history of nicotine dependence: Secondary | ICD-10-CM | POA: Diagnosis not present

## 2021-02-10 DIAGNOSIS — Z7982 Long term (current) use of aspirin: Secondary | ICD-10-CM | POA: Diagnosis not present

## 2021-02-10 DIAGNOSIS — N3941 Urge incontinence: Secondary | ICD-10-CM | POA: Diagnosis not present

## 2021-02-10 DIAGNOSIS — I10 Essential (primary) hypertension: Secondary | ICD-10-CM | POA: Diagnosis not present

## 2021-02-10 DIAGNOSIS — Z9181 History of falling: Secondary | ICD-10-CM | POA: Diagnosis not present

## 2021-03-04 DIAGNOSIS — H52203 Unspecified astigmatism, bilateral: Secondary | ICD-10-CM | POA: Diagnosis not present

## 2021-03-04 DIAGNOSIS — H524 Presbyopia: Secondary | ICD-10-CM | POA: Diagnosis not present

## 2021-03-04 DIAGNOSIS — H25813 Combined forms of age-related cataract, bilateral: Secondary | ICD-10-CM | POA: Diagnosis not present

## 2021-03-04 DIAGNOSIS — H5213 Myopia, bilateral: Secondary | ICD-10-CM | POA: Diagnosis not present

## 2021-05-11 DIAGNOSIS — Z23 Encounter for immunization: Secondary | ICD-10-CM | POA: Diagnosis not present

## 2021-09-10 DIAGNOSIS — M25561 Pain in right knee: Secondary | ICD-10-CM | POA: Diagnosis not present

## 2021-10-12 ENCOUNTER — Ambulatory Visit
Admission: RE | Admit: 2021-10-12 | Discharge: 2021-10-12 | Disposition: A | Discharge status: Home / Self Care | Payer: Medicare PPO | Source: Ambulatory Visit | Attending: Sports Medicine | Admitting: Sports Medicine

## 2021-10-12 ENCOUNTER — Other Ambulatory Visit: Payer: Self-pay | Admitting: Sports Medicine

## 2021-10-12 DIAGNOSIS — S8991XA Unspecified injury of right lower leg, initial encounter: Secondary | ICD-10-CM | POA: Diagnosis not present

## 2021-10-12 DIAGNOSIS — M25561 Pain in right knee: Secondary | ICD-10-CM | POA: Diagnosis not present

## 2021-10-12 DIAGNOSIS — R52 Pain, unspecified: Secondary | ICD-10-CM

## 2021-10-12 DIAGNOSIS — M1711 Unilateral primary osteoarthritis, right knee: Secondary | ICD-10-CM | POA: Diagnosis not present

## 2021-11-22 DIAGNOSIS — N3941 Urge incontinence: Secondary | ICD-10-CM | POA: Diagnosis not present

## 2021-11-22 DIAGNOSIS — I10 Essential (primary) hypertension: Secondary | ICD-10-CM | POA: Diagnosis not present

## 2021-11-22 DIAGNOSIS — Z79899 Other long term (current) drug therapy: Secondary | ICD-10-CM | POA: Diagnosis not present

## 2021-11-22 DIAGNOSIS — Z0000 Encounter for general adult medical examination without abnormal findings: Secondary | ICD-10-CM | POA: Diagnosis not present

## 2022-02-03 ENCOUNTER — Ambulatory Visit: Payer: Medicare PPO | Admitting: Podiatry

## 2022-02-03 DIAGNOSIS — B353 Tinea pedis: Secondary | ICD-10-CM | POA: Diagnosis not present

## 2022-02-03 MED ORDER — KETOCONAZOLE 2 % EX CREA: 1.0000 | TOPICAL_CREAM | Freq: Every day | CUTANEOUS | 0 refills | Status: DC

## 2022-02-03 NOTE — Progress Notes (Signed)
  Subjective:  Patient ID: Ashley Rogers, female    DOB: 28-Jan-1941,  MRN: [id number]  Chief Complaint  Patient presents with   foot care    Patient is here for routine foot care.    81 y.o. female presents with concern for bilateral foot redness peeling skin dryness.  She states that she is not having issue with her nails at this time.  She is more so concerned with the peeling skin present on the bottom of the feet.  She denies that it is very itchy.  She has noticed this for months.  She does use a lotion daily but it has not helped with the dry peeling skin.  Past Medical History:  Diagnosis Date   Hypertension    RLS (restless legs syndrome)    Vertigo     Allergies  Allergen Reactions   Codeine Nausea And Vomiting    ROS: Negative except as per HPI above  Objective:  General: AAO x3, NAD  Dermatological: With inspection and palpation of the right and left lower extremities there is noted to be red rash present bilateral plantar foot in moccasin distriubution with xerotic peeling skin noted.   Vascular:  Dorsalis Pedis artery and Posterior Tibial artery pedal pulses are 2/4 bilateral.  Capillary fill time < 3 sec to all digits.   Neruologic: Grossly intact via light touch bilateral. Protective threshold intact to all sites bilateral.   Musculoskeletal: No gross boney pedal deformities bilateral. No pain, crepitus, or limitation noted with foot and ankle range of motion bilateral. Muscular strength 5/5 in all groups tested bilateral.  Gait: Unassisted, Nonantalgic.   No images are attached to the encounter.  Assessment:   1. Tinea pedis of both feet      Plan:  Patient was evaluated and treated and all questions answered.  #Ahtletes foot bilateral, xerosis Discussed the etiology and treatment options for tinea pedis.  Discussed topical and oral treatment.  Recommended topical treatment with 2% ketoconazole cream.  This was sent to the patient's pharmacy.  Also  discussed appropriate foot hygiene, use of antifungal spray such as Tinactin in shoes, as well as cleaning her foot surfaces such as showers and bathroom floors with bleach.   Return in about 4 weeks (around 03/03/2022).          Everitt Amber, DPM [facility]

## 2022-02-10 ENCOUNTER — Ambulatory Visit: Payer: Medicare PPO | Admitting: Podiatry

## 2022-03-03 ENCOUNTER — Ambulatory Visit: Payer: Medicare PPO | Admitting: Podiatry

## 2022-03-03 DIAGNOSIS — B353 Tinea pedis: Secondary | ICD-10-CM | POA: Diagnosis not present

## 2022-03-03 MED ORDER — KETOCONAZOLE 2 % EX CREA: 1.0000 | TOPICAL_CREAM | Freq: Every day | CUTANEOUS | 0 refills | Status: DC

## 2022-03-03 NOTE — Progress Notes (Signed)
  Subjective:  Patient ID: Ashley Rogers, female    DOB: 09/03/1940,  MRN: [id number]  Chief Complaint  Patient presents with   Callouses    4 weeks follow up for tinea pedis of both feet. Patient continues to use cream that was prescribed.     81 y.o. female presents with concern for bilateral foot redness peeling skin dryness.  She reports that she has been doing well since last visit.  She has been using the ketoconazole 2% cream.  Thinks that the issue has improved but not entirely sure.   Past Medical History:  Diagnosis Date   Hypertension    RLS (restless legs syndrome)    Vertigo     Allergies  Allergen Reactions   Codeine Nausea And Vomiting    ROS: Negative except as per HPI above  Objective:  General: AAO x3, NAD  Dermatological: Significant decrease in presence of xerosis of the bilateral plantar foot.  No red moccasin distribution of red rash at this point.  Fully resolved tinea pedis infection  Vascular:  Dorsalis Pedis artery and Posterior Tibial artery pedal pulses are 2/4 bilateral.  Capillary fill time < 3 sec to all digits.   Neruologic: Grossly intact via light touch bilateral. Protective threshold intact to all sites bilateral.   Musculoskeletal: No gross boney pedal deformities bilateral. No pain, crepitus, or limitation noted with foot and ankle range of motion bilateral. Muscular strength 5/5 in all groups tested bilateral.  Gait: Unassisted, Nonantalgic.   No images are attached to the encounter.  Assessment:   1. Tinea pedis of both feet       Plan:  Patient was evaluated and treated and all questions answered.  #Ahtletes foot bilateral, xerosis -Resolved after 4-week course of ketoconazole 2% cream -We will send refill of ketoconazole 2% cream to apply as needed to the feet if the xerosis or red rash return Discussed the etiology and treatment options for tinea pedis.  Discussed topical and oral treatment.  Recommended topical  treatment with 2% ketoconazole cream.  This was sent to the patient's pharmacy.  Also discussed appropriate foot hygiene, use of antifungal spray such as Tinactin in shoes, as well as cleaning her foot surfaces such as showers and bathroom floors with bleach.   Return if symptoms worsen or fail to improve.          Corinna Gab, DPM [facility] / [facility]

## 2022-04-07 DIAGNOSIS — H52203 Unspecified astigmatism, bilateral: Secondary | ICD-10-CM | POA: Diagnosis not present

## 2022-04-07 DIAGNOSIS — H524 Presbyopia: Secondary | ICD-10-CM | POA: Diagnosis not present

## 2022-04-07 DIAGNOSIS — H5213 Myopia, bilateral: Secondary | ICD-10-CM | POA: Diagnosis not present

## 2022-04-07 DIAGNOSIS — H25813 Combined forms of age-related cataract, bilateral: Secondary | ICD-10-CM | POA: Diagnosis not present

## 2022-07-26 DIAGNOSIS — R200 Anesthesia of skin: Secondary | ICD-10-CM | POA: Diagnosis not present

## 2022-07-26 DIAGNOSIS — J069 Acute upper respiratory infection, unspecified: Secondary | ICD-10-CM | POA: Diagnosis not present

## 2022-07-26 DIAGNOSIS — R051 Acute cough: Secondary | ICD-10-CM | POA: Diagnosis not present

## 2022-07-26 DIAGNOSIS — Z03818 Encounter for observation for suspected exposure to other biological agents ruled out: Secondary | ICD-10-CM | POA: Diagnosis not present

## 2022-10-06 DIAGNOSIS — L658 Other specified nonscarring hair loss: Secondary | ICD-10-CM | POA: Diagnosis not present

## 2022-10-06 DIAGNOSIS — L680 Hirsutism: Secondary | ICD-10-CM | POA: Diagnosis not present

## 2023-01-05 DIAGNOSIS — Z87891 Personal history of nicotine dependence: Secondary | ICD-10-CM | POA: Diagnosis not present

## 2023-01-05 DIAGNOSIS — M1990 Unspecified osteoarthritis, unspecified site: Secondary | ICD-10-CM | POA: Diagnosis not present

## 2023-01-05 DIAGNOSIS — N3941 Urge incontinence: Secondary | ICD-10-CM | POA: Diagnosis not present

## 2023-01-05 DIAGNOSIS — I87309 Chronic venous hypertension (idiopathic) without complications of unspecified lower extremity: Secondary | ICD-10-CM | POA: Diagnosis not present

## 2023-01-05 DIAGNOSIS — Z809 Family history of malignant neoplasm, unspecified: Secondary | ICD-10-CM | POA: Diagnosis not present

## 2023-01-05 DIAGNOSIS — Z823 Family history of stroke: Secondary | ICD-10-CM | POA: Diagnosis not present

## 2023-01-05 DIAGNOSIS — Z8249 Family history of ischemic heart disease and other diseases of the circulatory system: Secondary | ICD-10-CM | POA: Diagnosis not present

## 2023-01-05 DIAGNOSIS — I10 Essential (primary) hypertension: Secondary | ICD-10-CM | POA: Diagnosis not present

## 2023-01-05 DIAGNOSIS — Z9181 History of falling: Secondary | ICD-10-CM | POA: Diagnosis not present

## 2023-03-03 DIAGNOSIS — Z79899 Other long term (current) drug therapy: Secondary | ICD-10-CM | POA: Diagnosis not present

## 2023-03-03 DIAGNOSIS — R252 Cramp and spasm: Secondary | ICD-10-CM | POA: Diagnosis not present

## 2023-03-03 DIAGNOSIS — N3941 Urge incontinence: Secondary | ICD-10-CM | POA: Diagnosis not present

## 2023-03-03 DIAGNOSIS — I10 Essential (primary) hypertension: Secondary | ICD-10-CM | POA: Diagnosis not present

## 2023-03-03 DIAGNOSIS — Z1331 Encounter for screening for depression: Secondary | ICD-10-CM | POA: Diagnosis not present

## 2023-03-03 DIAGNOSIS — Z0001 Encounter for general adult medical examination with abnormal findings: Secondary | ICD-10-CM | POA: Diagnosis not present

## 2023-05-31 DIAGNOSIS — M25511 Pain in right shoulder: Secondary | ICD-10-CM | POA: Diagnosis not present

## 2023-05-31 DIAGNOSIS — I10 Essential (primary) hypertension: Secondary | ICD-10-CM | POA: Diagnosis not present

## 2023-05-31 DIAGNOSIS — G5601 Carpal tunnel syndrome, right upper limb: Secondary | ICD-10-CM | POA: Diagnosis not present

## 2023-05-31 DIAGNOSIS — Z79899 Other long term (current) drug therapy: Secondary | ICD-10-CM | POA: Diagnosis not present

## 2023-06-12 DIAGNOSIS — M542 Cervicalgia: Secondary | ICD-10-CM | POA: Diagnosis not present

## 2023-06-12 DIAGNOSIS — I10 Essential (primary) hypertension: Secondary | ICD-10-CM | POA: Diagnosis not present

## 2023-07-06 DIAGNOSIS — L658 Other specified nonscarring hair loss: Secondary | ICD-10-CM | POA: Diagnosis not present

## 2023-07-06 DIAGNOSIS — L680 Hirsutism: Secondary | ICD-10-CM | POA: Diagnosis not present

## 2023-07-20 ENCOUNTER — Emergency Department (HOSPITAL_COMMUNITY)

## 2023-07-20 ENCOUNTER — Other Ambulatory Visit: Payer: Self-pay

## 2023-07-20 ENCOUNTER — Emergency Department (HOSPITAL_COMMUNITY): Admission: EM | Admit: 2023-07-20 | Discharge: 2023-07-20 | Disposition: A | Discharge status: Home / Self Care

## 2023-07-20 DIAGNOSIS — R42 Dizziness and giddiness: Secondary | ICD-10-CM | POA: Diagnosis not present

## 2023-07-20 DIAGNOSIS — I6782 Cerebral ischemia: Secondary | ICD-10-CM | POA: Diagnosis not present

## 2023-07-20 DIAGNOSIS — Z79899 Other long term (current) drug therapy: Secondary | ICD-10-CM | POA: Insufficient documentation

## 2023-07-20 DIAGNOSIS — I10 Essential (primary) hypertension: Secondary | ICD-10-CM | POA: Insufficient documentation

## 2023-07-20 LAB — URINALYSIS, MICROSCOPIC (REFLEX): Bacteria, UA: NONE SEEN

## 2023-07-20 LAB — CBC
HCT: 40.2 % (ref 36.0–46.0)
Hemoglobin: 13.4 g/dL (ref 12.0–15.0)
MCH: 29.9 pg (ref 26.0–34.0)
MCHC: 33.3 g/dL (ref 30.0–36.0)
MCV: 89.7 fL (ref 80.0–100.0)
Platelets: 333 10*3/uL (ref 150–400)
RBC: 4.48 MIL/uL (ref 3.87–5.11)
RDW: 12.5 % (ref 11.5–15.5)
WBC: 9.5 10*3/uL (ref 4.0–10.5)
nRBC: 0 % (ref 0.0–0.2)

## 2023-07-20 LAB — URINALYSIS, ROUTINE W REFLEX MICROSCOPIC
Bilirubin Urine: NEGATIVE
Glucose, UA: NEGATIVE mg/dL
Hgb urine dipstick: NEGATIVE
Ketones, ur: NEGATIVE mg/dL
Nitrite: NEGATIVE
Protein, ur: NEGATIVE mg/dL
Specific Gravity, Urine: <1.005 — ABNORMAL LOW (ref 1.005–1.030)
pH: 6 (ref 5.0–8.0)

## 2023-07-20 LAB — BASIC METABOLIC PANEL WITH GFR
Anion gap: 12 (ref 5–15)
BUN: 14 mg/dL (ref 8–23)
CO2: 21 mmol/L — ABNORMAL LOW (ref 22–32)
Calcium: 9.2 mg/dL (ref 8.9–10.3)
Chloride: 103 mmol/L (ref 98–111)
Creatinine, Ser: 0.82 mg/dL (ref 0.44–1.00)
GFR, Estimated: >60 mL/min (ref >60)
Glucose, Bld: 89 mg/dL (ref 70–99)
Potassium: 3.8 mmol/L (ref 3.5–5.1)
Sodium: 136 mmol/L (ref 135–145)

## 2023-07-20 MED ORDER — MECLIZINE HCL 12.5 MG PO TABS: 12.5000 mg | ORAL_TABLET | Freq: Three times a day (TID) | ORAL | 0 refills | Status: AC | PRN

## 2023-07-20 NOTE — ED Notes (Addendum)
 Pt understood d/c instructions and when to return to the ED. Follow up referral were reviewed. VS retaken and pt left w/ husband to go home

## 2023-07-20 NOTE — Discharge Instructions (Addendum)
 Please follow-up with your neurologist in regards to recent symptoms and ER visit.  Today your labs and imaging are all reassuring and you most likely had an episode of benign lightheadedness.  I have prescribed you a baby dose of meclizine if you do become dizzy again.  Please monitor your symptoms and if symptoms change or worsen please return to the ER.

## 2023-07-20 NOTE — ED Notes (Signed)
 Pt quickly assessed by triage RN while waiting for triage room to open. Vitals taken.

## 2023-07-20 NOTE — ED Provider Notes (Signed)
 [place] EMERGENCY DEPARTMENT AT [facility] Provider Note   CSN: [id number] Arrival date & time: 07/20/23  1119     History  Chief Complaint  Patient presents with   Dizziness    Ashley Rogers is a 83 y.o. female history of hypertension, vertigo presented after 2 minutes worth of lightheadedness that began an hour prior to arrival.  Patient is on the way to [place] open grave unfortunately and while driving experienced lightheadedness for last about 2 minutes to the point where she needed to pull over.  Patient denies any vision changes, headache, neck pain, chest pain shortness of breath paresthesias facial droop dysuria recent illnesses tinnitus.  Patient does note that she sees a neurologist on the 14th for headache that has been bothering her for the past few months that is posterior in nature and worse at the end of the day but gets better with aspirin.  Patient did not have a headache today.  Patient states this felt different than her vertigo but cannot further explain.  Home Medications Prior to Admission medications   Medication Sig Start Date End Date Taking? Authorizing Provider  meclizine (ANTIVERT) 12.5 MG tablet Take 1 tablet (12.5 mg total) by mouth 3 (three) times daily as needed for up to 10 days for dizziness. 07/20/23 07/30/23 Yes Lelah Rennaker, Beverly Gust, PA-C  amLODipine (NORVASC) 2.5 MG tablet Take 2.5 mg by mouth daily.    [provider]  b complex vitamins capsule Take 1 capsule by mouth daily.    [provider]  Coenzyme Q10 15 MG CAPS Take 30 mg by mouth daily.    [provider]  ibuprofen (ADVIL,MOTRIN) 200 MG tablet Take 800 mg by mouth every 6 (six) hours as needed for moderate pain.    [provider]  ketoconazole (NIZORAL) 2 % cream Apply 1 Application topically daily. 02/03/22   Standiford, Jenelle Mages, DPM  ketoconazole (NIZORAL) 2 % cream Apply 1 Application topically daily. 03/03/22   Standiford, Jenelle Mages, DPM   Multiple Vitamins-Minerals (CENTRUM PO) Take 1 tablet by mouth daily.    [provider]  Multiple Vitamins-Minerals (MULTI-VITAMIN GUMMIES PO) Take 50 mg by mouth daily.    [provider]  NON FORMULARY Take 1.5 mLs by mouth daily. Krill oil / tumeric    [provider]  NON FORMULARY Take 1 tablet by mouth daily. Focus Factor    [provider]  NON FORMULARY Take 1 tablet by mouth daily. Mega Reds    [provider]  olmesartan (BENICAR) 40 MG tablet Take 40 mg by mouth daily.    [provider]  Omega-3 Fatty Acids (FISH OIL) 1200 MG CAPS Take 1,200 mg by mouth 2 (two) times daily.    [provider]  oxybutynin (DITROPAN-XL) 10 MG 24 hr tablet Take 10 mg by mouth daily.    [provider]      Allergies    Codeine    Review of Systems   Review of Systems  Neurological:  Positive for dizziness.    Physical Exam Updated Vital Signs BP (!) 168/63 (BP Location: Right Arm)   Pulse 78   Temp 97.6 F (36.4 C) (Oral)   Resp 16   SpO2 100%  Physical Exam Vitals reviewed.  Constitutional:      General: She is not in acute distress. HENT:     Head: Normocephalic and atraumatic.     Right Ear: Tympanic membrane normal.  Left Ear: Tympanic membrane normal.  Eyes:     Extraocular Movements: Extraocular movements intact.     Conjunctiva/sclera: Conjunctivae normal.     Pupils: Pupils are equal, round, and reactive to light.  Cardiovascular:     Rate and Rhythm: Normal rate and regular rhythm.     Pulses: Normal pulses.     Heart sounds: Normal heart sounds.     Comments: 2+ bilateral radial/dorsalis pedis pulses with regular rate Pulmonary:     Effort: Pulmonary effort is normal. No respiratory distress.     Breath sounds: Normal breath sounds.  Abdominal:     Palpations: Abdomen is soft.     Tenderness: There is no abdominal tenderness. There is no guarding or rebound.  Musculoskeletal:         General: Normal range of motion.     Cervical back: Normal range of motion and neck supple.     Comments: 5 out of 5 bilateral grip/leg extension strength  Skin:    General: Skin is warm and dry.     Capillary Refill: Capillary refill takes less than 2 seconds.  Neurological:     General: No focal deficit present.     Mental Status: She is alert and oriented to person, place, and time.     Sensory: Sensation is intact.     Motor: Motor function is intact.     Coordination: Coordination is intact.     Gait: Gait is intact.     Comments: Sensation intact in all 4 limbs Cranial nerves III through XII intact Vision grossly intact  Psychiatric:        Mood and Affect: Mood normal.     ED Results / Procedures / Treatments   Labs (all labs ordered are listed, but only abnormal results are displayed) Labs Reviewed  BASIC METABOLIC PANEL WITH GFR - Abnormal; Notable for the following components:      Result Value   CO2 21 (*)    All other components within normal limits  URINALYSIS, ROUTINE W REFLEX MICROSCOPIC - Abnormal; Notable for the following components:   Specific Gravity, Urine <1.005 (*)    Leukocytes,Ua SMALL (*)    All other components within normal limits  CBC  URINALYSIS, MICROSCOPIC (REFLEX)  CBG MONITORING, ED    EKG EKG Interpretation Date/Time:  Thursday July 20 2023 11:43:36 EDT Ventricular Rate:  76 PR Interval:  166 QRS Duration:  122 QT Interval:  422 QTC Calculation: 474 R Axis:   -2  Text Interpretation: Normal sinus rhythm Right bundle branch block Minimal voltage criteria for LVH, may be normal variant ( R in aVL ) Inferior infarct , age undetermined Abnormal ECG When compared with ECG of 25-Feb-2001 15:39, PREVIOUS ECG IS PRESENT Confirmed by Estanislado Pandy [phone]) on 07/20/2023 12:48:49 PM  Radiology CT Head Wo Contrast Result Date: 07/20/2023 CLINICAL DATA:  Dizziness. EXAM: CT HEAD WITHOUT CONTRAST TECHNIQUE: Contiguous axial images were obtained  from the base of the skull through the vertex without intravenous contrast. RADIATION DOSE REDUCTION: This exam was performed according to the departmental dose-optimization program which includes automated exposure control, adjustment of the mA and/or kV according to patient size and/or use of iterative reconstruction technique. COMPARISON:  Head CT dated 11/09/2017. FINDINGS: Brain: Mild age-related atrophy and chronic microvascular ischemic changes. There is no acute intracranial hemorrhage. No mass effect or midline shift. No extra-axial fluid collection. Vascular: No hyperdense vessel or unexpected calcification. Skull: Normal. Negative for fracture or focal lesion. Sinuses/Orbits: No  acute finding. Other: None IMPRESSION: 1. No acute intracranial pathology. 2. Mild age-related atrophy and chronic microvascular ischemic changes. Electronically Signed   By: Elgie Collard M.D.   On: 07/20/2023 15:19    Procedures Procedures    Medications Ordered in ED Medications - No data to display  ED Course/ Medical Decision Making/ A&P                                 Medical Decision Making Amount and/or Complexity of Data Reviewed Labs: ordered. Radiology: ordered.   Carmelina Noun 83 y.o. presented today for dizziness. Working DDx that I considered at this time includes, but not limited to, CVA/TIA, arrhythmia, CNS lesion, BPPV, vestibular labyrinthitis, Meniere's, Ramsey-Hunt, polypharmacy, orthostatic hypotension, electrolyte abnormalities.  R/o DDx: CVA/TIA, arrhythmia, CNS lesion, BPPV, vestibular labyrinthitis, Meniere's, Ramsey-Hunt, polypharmacy, orthostatic hypotension, electrolyte abnormalities: These are considered less likely due to history of present illness, physical exam, lab/imaging findings  Review of prior external notes: 07/06/2023 office visit  Unique Tests and My Independent Interpretation:  EKG: Sinus 76 bpm, right bundle branch block, no signs of right heart strain or  ischemia CBC: Unremarkable BMP: Unremarkable UA: Unremarkable CT Head w/o Contrast: No acute findings  Social Determinants of Health: none  Discussion with Independent Historian:  Husband  Discussion of Management of Tests: None  Risk: Medium: prescription drug management  Risk Stratification Score: None  Staffed with Young, DO  Plan: On exam patient was no acute distress with stable vitals. Physical exam showed no acute findings and patient ultimately had reassuring physical and neurologic exam.  Patient is currently asymptomatic and what she describes does sound like she had an episode of vertigo which she does a history of.  Patient was not endorsing any associated symptoms with this and so we will check labs and CT head without contrast.  At this time do not think patient needs CTA as the posterior headache she mention has been going on for months and patient is not had a dizzy spell since last time she was here which was in 2020 in which she had vertigo.  At this time patient is not requiring any medications.  Patient's labs and imaging are reassuring.  At this time patient and I had shared decision making and patient states she feels comfortable going home and following up with her neurologist.  We agreed for a short prescription of meclizine in the meantime in case patient becomes dizzy again which is not unreasonable so we will order this.  During patient's ED stay she did not have any recurrent symptoms and remained asymptomatic.  Patient was given return precautions. Patient stable for discharge at this time.  Patient verbalized understanding of plan.  This chart was dictated using voice recognition software.  Despite best efforts to proofread,  errors can occur which can change the documentation meaning.        Final Clinical Impression(s) / ED Diagnoses Final diagnoses:  Lightheadedness    Rx / DC Orders ED Discharge Orders          Ordered    meclizine  (ANTIVERT) 12.5 MG tablet  3 times daily PRN        07/20/23 1538              Netta Corrigan, PA-C 07/20/23 1541    Coral Spikes, DO 07/20/23 1707

## 2023-07-20 NOTE — ED Triage Notes (Signed)
 Patient had one episode of dizziness approximately 1 hour PTA. This episode happened while she was driving, so she pulled over to the side of the road. Dizziness lasted for 5 minutes and then resolved. No dizziness or headache on arrival to ED.

## 2023-07-31 ENCOUNTER — Ambulatory Visit: Admitting: Neurology

## 2023-07-31 ENCOUNTER — Encounter: Payer: Self-pay | Admitting: Neurology

## 2023-07-31 ENCOUNTER — Other Ambulatory Visit: Payer: Self-pay | Admitting: Neurology

## 2023-07-31 VITALS — BP 158/70 | HR 72 | Ht 64.0 in | Wt 155.0 lb

## 2023-07-31 DIAGNOSIS — R200 Anesthesia of skin: Secondary | ICD-10-CM | POA: Diagnosis not present

## 2023-07-31 DIAGNOSIS — M5412 Radiculopathy, cervical region: Secondary | ICD-10-CM | POA: Diagnosis not present

## 2023-07-31 DIAGNOSIS — M542 Cervicalgia: Secondary | ICD-10-CM

## 2023-07-31 MED ORDER — TIZANIDINE HCL 2 MG PO CAPS: 2.0000 mg | ORAL_CAPSULE | Freq: Every evening | ORAL | 4 refills | Status: DC | PRN

## 2023-07-31 MED ORDER — TIZANIDINE HCL 2 MG PO TABS: 2.0000 mg | ORAL_TABLET | Freq: Every evening | ORAL | 4 refills | Status: AC | PRN

## 2023-07-31 NOTE — Progress Notes (Signed)
 [facility]rology [facility] Clinic Note - Initial Visit   Date: 07/31/2023   Ashley Rogers MRN: [id number] DOB: 11-09-1940   Dear Dr. Docia Chuck:  Thank you for your kind referral of Ashley Rogers for consultation of right hand numbness. Although her history is well known to you, please allow [place] to reiterate it for the purpose of our medical record. The patient was accompanied to the clinic by self.   Ashley Rogers is a 83 y.o. right-handed female with hypertension and RLS presenting for evaluation of right hand numbness and neck pain.   IMPRESSION/PLAN: Cervicalgia with possible cervical radiculopathy.    - Start neck PT  - Start tizanidine 2mg  at bedtime  - NCS/EMG of the right arm  Right carpal tunnel syndrome, moderate.  Symptoms could be getting worse.  - EDX will look for interval change  Return to clinic in 3 months  ------------------------------------------------------------- History of present illness: Starting earlier this year, she developed numbness involving the right hand, worse over the fingertips.  Symptoms are constant.  Preceding this, she felt a pop in the shoulder.  She also has achy pain over the base of the right side of her head.  She tried NSAIDs for severe pain, which helped.    Out-side paper records, electronic medical record, and images have been reviewed where available and summarized as:  CT head 07/20/2023 1. No acute intracranial pathology. 2. Mild age-related atrophy and chronic microvascular ischemic changes.   NCS/EMG of the right arm 05/24/2018: Right median neuropathy at or distal to the wrist, consistent with a clinical diagnosis of carpal tunnel syndrome.  Overall, these findings are at least moderate in degree electrically. Chronic C6 radiculopathy affecting the right upper extremity, mild in degree electrically.    Past Medical History:  Diagnosis Date   Hypertension    RLS (restless legs syndrome)    Vertigo      Past Surgical History:  Procedure Laterality Date   TONSILLECTOMY       Medications:  Outpatient Encounter Medications as of 07/31/2023  Medication Sig   amLODipine (NORVASC) 2.5 MG tablet Take 2.5 mg by mouth daily.   ibuprofen (ADVIL,MOTRIN) 200 MG tablet Take 800 mg by mouth every 6 (six) hours as needed for moderate pain.   Magnesium Citrate (MAGNESIUM GUMMIES PO) Take by mouth.   Multiple Vitamins-Minerals (CENTRUM PO) Take 1 tablet by mouth daily.   olmesartan (BENICAR) 40 MG tablet Take 40 mg by mouth daily.   oxybutynin (DITROPAN-XL) 10 MG 24 hr tablet Take 10 mg by mouth daily.   tiZANidine (ZANAFLEX) 2 MG tablet Take 1 tablet (2 mg total) by mouth at bedtime as needed for muscle spasms (neck pain).   [DISCONTINUED] tizanidine (ZANAFLEX) 2 MG capsule Take 1 capsule (2 mg total) by mouth at bedtime as needed for muscle spasms.   [DISCONTINUED] b complex vitamins capsule Take 1 capsule by mouth daily. (Patient not taking: Reported on 07/31/2023)   [DISCONTINUED] Coenzyme Q10 15 MG CAPS Take 30 mg by mouth daily. (Patient not taking: Reported on 07/31/2023)   [DISCONTINUED] ketoconazole (NIZORAL) 2 % cream Apply 1 Application topically daily. (Patient not taking: Reported on 07/31/2023)   [DISCONTINUED] ketoconazole (NIZORAL) 2 % cream Apply 1 Application topically daily. (Patient not taking: Reported on 07/31/2023)   [DISCONTINUED] Multiple Vitamins-Minerals (MULTI-VITAMIN GUMMIES PO) Take 50 mg by mouth daily. (Patient not taking: Reported on 07/31/2023)   [DISCONTINUED] NON FORMULARY Take 1.5 mLs by mouth daily. Krill oil / tumeric (Patient  not taking: Reported on 07/31/2023)   [DISCONTINUED] NON FORMULARY Take 1 tablet by mouth daily. Focus Factor (Patient not taking: Reported on 07/31/2023)   [DISCONTINUED] NON FORMULARY Take 1 tablet by mouth daily. Mega Reds (Patient not taking: Reported on 07/31/2023)   [DISCONTINUED] Omega-3 Fatty Acids (FISH OIL) 1200 MG CAPS Take 1,200 mg by  mouth 2 (two) times daily. (Patient not taking: Reported on 07/31/2023)   No facility-administered encounter medications on file as of 07/31/2023.    Allergies:  Allergies  Allergen Reactions   Codeine Nausea And Vomiting    Family History: Family History  Problem Relation Age of Onset   Hypertension Mother    Dementia Mother    Hypertension Father    Heart disease Father    Cervical cancer Sister     Social History: Social History   Tobacco Use   Smoking status: Former    Current packs/day: 0.00    Types: Cigarettes    Quit date: 1995    Years since quitting: 30.3   Smokeless tobacco: Never  Vaping Use   Vaping status: Never Used  Substance Use Topics   Alcohol use: Yes    Alcohol/week: 0.0 standard drinks of alcohol    Comment: Wine - 1 glass nightly   Drug use: Never   Social History   Social History Narrative   She lives with husband and grandson.  She had one grown daughter.    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 Right Handed   Are you currently employed ? No    What is your current occupation?   Do you live at home alone? Husband and 85 year grandson    Who lives with you? Husband and 7 year old grandson    What type of home do you live in: 1 story or 2 story? Lives in a one story home        Vital Signs:  BP (!) 158/70   Pulse 72   Ht 5\' 4"  (1.626 m)   Wt 155 lb (70.3 kg)   SpO2 97%   BMI 26.61 kg/m    Neurological Exam: MENTAL STATUS including orientation to time, place, person, recent and remote memory, attention span and concentration, language, and fund of knowledge is normal.  Speech is not dysarthric.  CRANIAL NERVES: II:  No visual field defects.     III-IV-VI: Pupils equal round and reactive to light.  Normal conjugate, extra-ocular eye movements in all directions of gaze.  No nystagmus.  No ptosis.   V:  Normal  facial sensation.    VII:  Normal facial symmetry and movements.   VIII:  Normal hearing and vestibular function.   IX-X:  Normal palatal movement.   XI:  Normal shoulder shrug and head rotation.   XII:  Normal tongue strength and range of motion, no deviation or fasciculation.  MOTOR:  No atrophy, fasciculations or abnormal movements.  No pronator drift.   Upper Extremity:  Right  Left  Deltoid  5/5   5/5   Biceps  5/5   5/5   Triceps  5/5   5/5   Wrist extensors  5/5   5/5   Wrist flexors  5/5   5/5   Finger extensors  5/5   5/5   Finger flexors  5/5   5/5   Dorsal interossei  5/5   5/5   Abductor pollicis  5/5   5/5   Tone (Ashworth scale)  0  0   Lower Extremity:  Right  Left  Hip flexors  5/5   5/5   Knee flexors  5/5   5/5   Knee extensors  5/5   5/5   Dorsiflexors  5/5   5/5   Plantarflexors  5/5   5/5   Toe extensors  5/5   5/5   Toe flexors  5/5   5/5   Tone (Ashworth scale)  0  0   MSRs:                                           Right        Left brachioradialis 2+  2+  biceps 2+  2+  triceps 2+  2+  patellar 2+  2+  ankle jerk 2+  2+  Hoffman no  no  plantar response down  down   SENSORY:  Normal and symmetric perception of light touch, pinprick, vibration, and temperature.   COORDINATION/GAIT: Normal finger-to- nose-finger.  Intact rapid alternating movements bilaterally.   Gait narrow based and stable.    Thank you for allowing me to participate in patient's care.  If I can answer any additional questions, I would be pleased to do so.    Sincerely,    Laraina Sulton K. Lydia Sams, DO

## 2023-07-31 NOTE — Patient Instructions (Signed)
 Start tizanidine 2mg  at bedtime as needed for pain  We will refer you to physical therapy  Nerve testing of the right arm  ELECTROMYOGRAM AND NERVE CONDUCTION STUDIES (EMG/NCS) INSTRUCTIONS  How to Prepare The neurologist conducting the EMG will need to know if you have certain medical conditions. Tell the neurologist and other EMG lab personnel if you: Have a pacemaker or any other electrical medical device Take blood-thinning medications Have hemophilia, a blood-clotting disorder that causes prolonged bleeding Bathing Take a shower or bath shortly before your exam in order to remove oils from your skin. Don't apply lotions or creams before the exam.  What to Expect You'll likely be asked to change into a hospital gown for the procedure and lie down on an examination table. The following explanations can help you understand what will happen during the exam.  Electrodes. The neurologist or a technician places surface electrodes at various locations on your skin depending on where you're experiencing symptoms. Or the neurologist may insert needle electrodes at different sites depending on your symptoms.  Sensations. The electrodes will at times transmit a tiny electrical current that you may feel as a twinge or spasm. The needle electrode may cause discomfort or pain that usually ends shortly after the needle is removed. If you are concerned about discomfort or pain, you may want to talk to the neurologist about taking a short break during the exam.  Instructions. During the needle EMG, the neurologist will assess whether there is any spontaneous electrical activity when the muscle is at rest - activity that isn't present in healthy muscle tissue - and the degree of activity when you slightly contract the muscle.  He or she will give you instructions on resting and contracting a muscle at appropriate times. Depending on what muscles and nerves the neurologist is examining, he or she may ask you  to change positions during the exam.  After your EMG You may experience some temporary, minor bruising where the needle electrode was inserted into your muscle. This bruising should fade within several days. If it persists, contact your primary care doctor.

## 2023-08-09 ENCOUNTER — Ambulatory Visit: Attending: Neurology

## 2023-08-09 DIAGNOSIS — R200 Anesthesia of skin: Secondary | ICD-10-CM | POA: Diagnosis not present

## 2023-08-09 DIAGNOSIS — M5412 Radiculopathy, cervical region: Secondary | ICD-10-CM | POA: Diagnosis not present

## 2023-08-09 DIAGNOSIS — M542 Cervicalgia: Secondary | ICD-10-CM | POA: Diagnosis not present

## 2023-08-09 NOTE — Therapy (Signed)
 OUTPATIENT PHYSICAL THERAPY CERVICAL EVALUATION   Patient Name: Ashley Rogers MRN: [id number] DOB:Jul 10, 1940, 83 y.o., female Today's Date: 08/10/2023  END OF SESSION:  PT End of Session - 08/09/23 1444     Visit Number 1    Number of Visits 9    Date for PT Re-Evaluation 09/06/23    PT Start Time 1230    PT Stop Time 1315    PT Time Calculation (min) 45 min    Activity Tolerance Patient tolerated treatment well    Behavior During Therapy WFL for tasks assessed/performed             Past Medical History:  Diagnosis Date   Hypertension    RLS (restless legs syndrome)    Vertigo    Past Surgical History:  Procedure Laterality Date   TONSILLECTOMY     Patient Active Problem List   Diagnosis Date Noted   Carpal tunnel syndrome of right wrist 04/26/2019   Muscle cramp 04/26/2019    PCP: Dr. Rochelle Chu  REFERRING PROVIDER: Vikas Wegmann, Donika K, DO  REFERRING DIAG: [phone] (ICD-10-CM) - Cervical radiculopathy M54.2 (ICD-10-CM) - Cervicalgia R20.0 (ICD-10-CM) - Numbness of right hand  THERAPY DIAG:  Cervical radiculopathy  Cervicalgia  Numbness of right hand  Rationale for Evaluation and Treatment: Rehabilitation  ONSET DATE: 07/31/2023  SUBJECTIVE:                                                                                                                                                                                                         SUBJECTIVE STATEMENT: Pt is here for numbness in R hand and R shoulder pain and neck pain. Pt reports she fell 30 years ago going up the steps. Ever since then she has always had ache in R upper trap area intermittently. Pt denies any significant injury when she fell, 30 years ago. Pt reports when she was working out, 2-3 months ago, she felt a "pop" or crack in her neck/shoulder area and she stared feeling numbness in R hand. Pt currently reports numbness is in constant in R hand with most notable in thumb, index finger,  middle finger and ring finger. Pain travels from base of neck to top of the shoulder and back of her head. Neck pain/headache gets worst at night. 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   Hand dominance: Right  PERTINENT HISTORY:  Vertigo, HTN  PAIN:  Are you having pain? Yes: NPRS scale: 5/10 Pain location: neck, R upper trap, R posterior head Pain description: achy, tender, radiating Aggravating  factors: turning head Relieving factors: none  PRECAUTIONS: None  RED FLAGS: None     WEIGHT BEARING RESTRICTIONS: No  FALLS:  Has patient fallen in last 6 months? No   PLOF: Independent  PATIENT GOALS: improve pain   OBJECTIVE:  Note: Objective measures were completed at Evaluation unless otherwise noted.  DIAGNOSTIC FINDINGS:  CT head 07/20/2023 1. No acute intracranial pathology. 2. Mild age-related atrophy and chronic microvascular ischemic changes.   NCS/EMG of the right arm 05/24/2018: Right median neuropathy at or distal to the wrist, consistent with a clinical diagnosis of carpal tunnel syndrome.  Overall, these findings are at least moderate in degree electrically. Chronic C6 radiculopathy affecting the right upper extremity, mild in degree electrically.     CERVICAL ROM:   Active ROM A/PROM (deg) eval  Flexion 55  Extension 35  Right lateral flexion   Left lateral flexion   Right rotation 45  Left rotation 50   (Blank rows = not tested)  UPPER EXTREMITY ROM: WFL bil with flexion, abduction, ER and IR  Active ROM Right eval Left eval  Shoulder flexion    Shoulder extension    Shoulder abduction    Shoulder adduction    Shoulder extension    Shoulder internal rotation    Shoulder external rotation    Elbow flexion    Elbow extension    Wrist flexion    Wrist extension    Wrist ulnar deviation    Wrist radial deviation    Wrist pronation    Wrist supination     (Blank rows = not tested)  UPPER EXTREMITY MMT:  MMT Right eval  Left eval  Shoulder flexion 5/5 5/5  Shoulder extension    Shoulder abduction 5/5 5/5  Shoulder adduction    Shoulder extension    Shoulder internal rotation 5/5 5/5  Shoulder external rotation 5/5 5/5  Middle trapezius    Lower trapezius    Elbow flexion 5/5 5/5  Elbow extension 5/5 5/5  Wrist flexion    Wrist extension    Wrist ulnar deviation    Wrist radial deviation    Wrist pronation    Wrist supination    Grip strength 35 lbs 32 lbs   (Blank rows = not tested)    TREATMENT DATE:  08/10/23 Manual therapy: Soft tissue massage to bil upper traps, cervical paraspinalis Grade III-IV mobilization of upper C-spine with lateral glides R to L to OA and C1-2, C4-6  TherEx: Supine cervical rotation with overpressure using pillow case: 2 x 10 R and L Supine lateral flexion stretch: 2 x 30" R and L                                                                                                                                 PATIENT EDUCATION:  Education details: see above Person educated: Patient Education method: Explanation Education comprehension: verbalized understanding  HOME EXERCISE PROGRAM: Access Code:  [email or web address] URL: [email or web address] Date: 08/09/2023 Prepared by: Susanna Epley  Exercises - Seated Assisted Cervical Rotation with Towel  - 1-2 x daily - 7 x weekly - 10 reps - Supine Cervical Sidebending Stretch  - 1-2 x daily - 7 x weekly - 3 reps - 30 sec hold  ASSESSMENT:  CLINICAL IMPRESSION: Patient is a 83 y.o. female who was seen today for physical therapy evaluation and treatment for neck pain with cervical radiculpathy in R UE. Patient demonstrates significantly decrease cervical ROM grossly and increased neural tension in R UE, and pain. These impairments are causing patient difficulties with turning head with ADLs, driving, and sleeping. Patient will benefit from skilled Pt to address these impairments and improve function.    OBJECTIVE IMPAIRMENTS: decreased ROM, hypomobility, impaired flexibility, postural dysfunction, and pain.   ACTIVITY LIMITATIONS: sleeping and driving  PARTICIPATION LIMITATIONS: cleaning, driving, shopping, and yard work  PERSONAL FACTORS: Age and Time since onset of injury/illness/exacerbation are also affecting patient's functional outcome.   REHAB POTENTIAL: Good  CLINICAL DECISION MAKING: Stable/uncomplicated  EVALUATION COMPLEXITY: Low   GOALS: Goals reviewed with patient? Yes  SHORT TERM GOALS= Long term goals (POC<4 weeks)   LONG TERM GOALS: Target date: 09/07/2023    Patient will demo at least 70 deg of cervical rotation without pain to improve ability to turn head when driving.  Baseline: 45 deg Right, 50 deg Left Goal status: INITIAL  2.  Pt will report of pain <2/10 in her neck/shoulders at worst to improve overall function. Baseline: 5/10 Goal status: INITIAL  3.  Pt will be I and compliant with HEP to self manage her symptoms to improve overall function and management of her symptoms.  Baseline: Initiated at eval Goal status: INITIAL    PLAN:  PT FREQUENCY: 2x/week  PT DURATION: 4 weeks  PLANNED INTERVENTIONS: 97164- PT Re-evaluation, 97750- Physical Performance Testing, 97110-Therapeutic exercises, 97530- Therapeutic activity, [id number]- Neuromuscular re-education, 97535- Self Care, 91478- Manual therapy, [id number]- Electrical stimulation (unattended), Patient/Family education, Joint mobilization, Spinal mobilization, Cryotherapy, and Moist heat  PLAN FOR NEXT SESSION: Continue to progress HEP   Kristine Phalen, PT 08/10/2023, 8:16 AM

## 2023-08-14 ENCOUNTER — Ambulatory Visit

## 2023-08-16 ENCOUNTER — Ambulatory Visit

## 2023-08-16 DIAGNOSIS — M542 Cervicalgia: Secondary | ICD-10-CM

## 2023-08-16 DIAGNOSIS — M5412 Radiculopathy, cervical region: Secondary | ICD-10-CM

## 2023-08-16 DIAGNOSIS — R200 Anesthesia of skin: Secondary | ICD-10-CM

## 2023-08-16 NOTE — Therapy (Signed)
 OUTPATIENT PHYSICAL THERAPY CERVICAL EVALUATION   Patient Name: Ashley Rogers MRN: [id number] DOB:14-Jun-1940, 83 y.o., female Today's Date: 08/16/2023  END OF SESSION:  PT End of Session - 08/16/23 0846     Visit Number 2    Number of Visits 9    Date for PT Re-Evaluation 09/06/23    PT Start Time 0845    PT Stop Time 0930    PT Time Calculation (min) 45 min    Activity Tolerance Patient tolerated treatment well    Behavior During Therapy [facility] for tasks assessed/performed             Past Medical History:  Diagnosis Date   Hypertension    RLS (restless legs syndrome)    Vertigo    Past Surgical History:  Procedure Laterality Date   TONSILLECTOMY     Patient Active Problem List   Diagnosis Date Noted   Carpal tunnel syndrome of right wrist 04/26/2019   Muscle cramp 04/26/2019    PCP: Dr. Rochelle Chu  REFERRING PROVIDER: Dariush Mcnellis, Donika K, DO  REFERRING DIAG: [phone] (ICD-10-CM) - Cervical radiculopathy M54.2 (ICD-10-CM) - Cervicalgia R20.0 (ICD-10-CM) - Numbness of right hand  THERAPY DIAG:  Cervical radiculopathy  Cervicalgia  Numbness of right hand  Rationale for Evaluation and Treatment: Rehabilitation  ONSET DATE: 07/31/2023  SUBJECTIVE:                                                                                                                                                                                                         SUBJECTIVE STATEMENT: Pt reprots symptoms are little better. Pt reports she has tried exercises but unsure if she is doing them correctly.  Hand dominance: Right  PERTINENT HISTORY:  Vertigo, HTN  PAIN:  Are you having pain? Yes: NPRS scale: 5/10 Pain location: neck, R upper trap, R posterior head Pain description: achy, tender, radiating Aggravating factors: turning head Relieving factors: none  PRECAUTIONS: None  RED FLAGS: None     WEIGHT BEARING RESTRICTIONS: No  FALLS:  Has patient fallen in last  6 months? No   PLOF: Independent  PATIENT GOALS: improve pain   OBJECTIVE:  Note: Objective measures were completed at Evaluation unless otherwise noted.  DIAGNOSTIC FINDINGS:  CT head 07/20/2023 1. No acute intracranial pathology. 2. Mild age-related atrophy and chronic microvascular ischemic changes.   NCS/EMG of the right arm 05/24/2018: Right median neuropathy at or distal to the wrist, consistent with a clinical diagnosis of carpal tunnel syndrome.  Overall, these findings are at least moderate in degree electrically.  Chronic C6 radiculopathy affecting the right upper extremity, mild in degree electrically.     CERVICAL ROM:   Active ROM A/PROM (deg) eval  Flexion 55  Extension 35  Right lateral flexion   Left lateral flexion   Right rotation 45  Left rotation 50   (Blank rows = not tested)  UPPER EXTREMITY ROM: WFL bil with flexion, abduction, ER and IR  Active ROM Right eval Left eval  Shoulder flexion    Shoulder extension    Shoulder abduction    Shoulder adduction    Shoulder extension    Shoulder internal rotation    Shoulder external rotation    Elbow flexion    Elbow extension    Wrist flexion    Wrist extension    Wrist ulnar deviation    Wrist radial deviation    Wrist pronation    Wrist supination     (Blank rows = not tested)  UPPER EXTREMITY MMT:  MMT Right eval Left eval  Shoulder flexion 5/5 5/5  Shoulder extension    Shoulder abduction 5/5 5/5  Shoulder adduction    Shoulder extension    Shoulder internal rotation 5/5 5/5  Shoulder external rotation 5/5 5/5  Middle trapezius    Lower trapezius    Elbow flexion 5/5 5/5  Elbow extension 5/5 5/5  Wrist flexion    Wrist extension    Wrist ulnar deviation    Wrist radial deviation    Wrist pronation    Wrist supination    Grip strength 35 lbs 32 lbs   (Blank rows = not tested)    TREATMENT DATE:  08/10/23 Manual therapy: Soft tissue massage to bil upper traps, levator  scapulae, R SCM, cervical paraspinalis Grade III-IV mobilization of upper and Mid C-spine with lateral glides R to L to OA and C1-2, C4-6 Passive lateral flexion stretch in sitting position and in supine position: 2 x 30" each position R and L  TherEx: Supine cervical rotation with overpressure using pillow case: 2 x 10 R and L Supine lateral flexion stretch: 2 x 30" R and L                                                                                                                              Pt educated on goals of HEP is to provide short to long term relief in her symptoms. Went through her HEP to ensure patient is performing them correctly without causing extra pain.  Pt denied at soreness at end of the session.   PATIENT EDUCATION:  Education details: see above Person educated: Patient Education method: Explanation Education comprehension: verbalized understanding  HOME EXERCISE PROGRAM: Access Code: [email or web address] URL: [email or web address] Date: 08/09/2023 Prepared by: Susanna Epley  Exercises - Seated Assisted Cervical Rotation with Towel  - 1-2 x daily - 7 x weekly - 10 reps - Supine Cervical Sidebending Stretch  - 1-2 x daily - 7 x weekly - 3 reps -  30 sec hold  ASSESSMENT:  CLINICAL IMPRESSION: Pt tends to turn body instead of turning her head when talking to someone next to her. Overall ROM improved with manual therapy and exercises. Pt will require further instructions on proper form of the HEP to ensure she is getting maximum relief with exercises and pain management.  OBJECTIVE IMPAIRMENTS: decreased ROM, hypomobility, impaired flexibility, postural dysfunction, and pain.   ACTIVITY LIMITATIONS: sleeping and driving  PARTICIPATION LIMITATIONS: cleaning, driving, shopping, and yard work  PERSONAL FACTORS: Age and Time since onset of injury/illness/exacerbation are also affecting patient's functional outcome.   REHAB POTENTIAL: Good  CLINICAL  DECISION MAKING: Stable/uncomplicated  EVALUATION COMPLEXITY: Low   GOALS: Goals reviewed with patient? Yes  SHORT TERM GOALS= Long term goals (POC<4 weeks)   LONG TERM GOALS: Target date: 09/07/2023    Patient will demo at least 70 deg of cervical rotation without pain to improve ability to turn head when driving.  Baseline: 45 deg Right, 50 deg Left Goal status: INITIAL  2.  Pt will report of pain <2/10 in her neck/shoulders at worst to improve overall function. Baseline: 5/10 Goal status: INITIAL  3.  Pt will be I and compliant with HEP to self manage her symptoms to improve overall function and management of her symptoms.  Baseline: Initiated at eval Goal status: INITIAL    PLAN:  PT FREQUENCY: 2x/week  PT DURATION: 4 weeks  PLANNED INTERVENTIONS: 97164- PT Re-evaluation, 97750- Physical Performance Testing, 97110-Therapeutic exercises, 97530- Therapeutic activity, [id number]- Neuromuscular re-education, 97535- Self Care, 86578- Manual therapy, [id number]- Electrical stimulation (unattended), Patient/Family education, Joint mobilization, Spinal mobilization, Cryotherapy, and Moist heat  PLAN FOR NEXT SESSION: Continue to progress HEP   Kristine Phalen, PT 08/16/2023, 8:47 AM

## 2023-08-22 ENCOUNTER — Ambulatory Visit: Attending: Neurology

## 2023-08-22 DIAGNOSIS — M5412 Radiculopathy, cervical region: Secondary | ICD-10-CM | POA: Insufficient documentation

## 2023-08-22 DIAGNOSIS — M542 Cervicalgia: Secondary | ICD-10-CM | POA: Diagnosis not present

## 2023-08-22 DIAGNOSIS — R200 Anesthesia of skin: Secondary | ICD-10-CM | POA: Diagnosis not present

## 2023-08-22 NOTE — Therapy (Signed)
 OUTPATIENT PHYSICAL THERAPY CERVICAL TREATMENT NOTE   Patient Name: Ashley Rogers MRN: [id number] DOB:[birth year], 83 y.o., female Today's Date: 08/22/2023  END OF SESSION:  PT End of Session - 08/22/23 1020     Visit Number 3    Number of Visits 9    Date for PT Re-Evaluation 09/06/23    PT Start Time 1015    PT Stop Time 1100    PT Time Calculation (min) 45 min    Activity Tolerance Patient tolerated treatment well    Behavior During Therapy [facility] for tasks assessed/performed             Past Medical History:  Diagnosis Date   Hypertension    RLS (restless legs syndrome)    Vertigo    Past Surgical History:  Procedure Laterality Date   TONSILLECTOMY     Patient Active Problem List   Diagnosis Date Noted   Carpal tunnel syndrome of right wrist 04/26/2019   Muscle cramp 04/26/2019    PCP: Dr. Rochelle Chu  REFERRING PROVIDER: Miia Blanks, Donika K, DO  REFERRING DIAG: [phone] (ICD-10-CM) - Cervical radiculopathy M54.2 (ICD-10-CM) - Cervicalgia R20.0 (ICD-10-CM) - Numbness of right hand  THERAPY DIAG:  Cervical radiculopathy  Cervicalgia  Numbness of right hand  Rationale for Evaluation and Treatment: Rehabilitation  ONSET DATE: 07/31/2023  SUBJECTIVE:                                                                                                                                                                                                         SUBJECTIVE STATEMENT: Pt reports she felt better for couple of days after last time. She is not having so much pain in the back of th ehead since last session but she is feeling stiffness, achyness in both of shoulder and constant numbness in thumb to ring finger. Pinky finger feels normal.  Hand dominance: Right  PERTINENT HISTORY:  Vertigo, HTN  PAIN:  Are you having pain? Yes: NPRS scale: 8/10 Pain location: neck, R upper trap, L shoulder Pain description: achy, tender, radiating Aggravating factors:  turning head Relieving factors: none  PRECAUTIONS: None  RED FLAGS: None     WEIGHT BEARING RESTRICTIONS: No  FALLS:  Has patient fallen in last 6 months? No   PLOF: Independent  PATIENT GOALS: improve pain   OBJECTIVE:  Note: Objective measures were completed at Evaluation unless otherwise noted.  DIAGNOSTIC FINDINGS:  CT head 07/20/2023 1. No acute intracranial pathology. 2. Mild age-related atrophy and chronic microvascular ischemic changes.   NCS/EMG of the right arm  05/24/2018: Right median neuropathy at or distal to the wrist, consistent with a clinical diagnosis of carpal tunnel syndrome.  Overall, these findings are at least moderate in degree electrically. Chronic C6 radiculopathy affecting the right upper extremity, mild in degree electrically.     CERVICAL ROM:   Active ROM A/PROM (deg) eval  Flexion 55  Extension 35  Right lateral flexion   Left lateral flexion   Right rotation 45  Left rotation 50   (Blank rows = not tested)  UPPER EXTREMITY ROM: WFL bil with flexion, abduction, ER and IR  Active ROM Right eval Left eval  Shoulder flexion    Shoulder extension    Shoulder abduction    Shoulder adduction    Shoulder extension    Shoulder internal rotation    Shoulder external rotation    Elbow flexion    Elbow extension    Wrist flexion    Wrist extension    Wrist ulnar deviation    Wrist radial deviation    Wrist pronation    Wrist supination     (Blank rows = not tested)  UPPER EXTREMITY MMT:  MMT Right eval Left eval  Shoulder flexion 5/5 5/5  Shoulder extension    Shoulder abduction 5/5 5/5  Shoulder adduction    Shoulder extension    Shoulder internal rotation 5/5 5/5  Shoulder external rotation 5/5 5/5  Middle trapezius    Lower trapezius    Elbow flexion 5/5 5/5  Elbow extension 5/5 5/5  Wrist flexion    Wrist extension    Wrist ulnar deviation    Wrist radial deviation    Wrist pronation    Wrist supination     Grip strength 35 lbs 32 lbs   (Blank rows = not tested)    TREATMENT DATE:  08/10/23 Manual therapy: Soft tissue massage to bil upper traps, levator scapulae, R SCM, cervical paraspinalis Scapular mobilization into protraction, retraction, elevation and depression Grade IV [place] joint depression, AC joint mobilization PROM of shoulder into scaption to full range without pain to improve neurla mobility through upper arm Passive stretching of posterior and middle scalenes Grade IV 1st rib inferior and PA mobilization  TherEx: Seated lateral flexion stretches: 2 x 30" Sidelying sshoulder elevation/depression, protraction and retraction: 20x each R only   PATIENT EDUCATION:  Education details: see above Person educated: Patient Education method: Explanation Education comprehension: verbalized understanding  HOME EXERCISE PROGRAM: Access Code: [email or web address] URL: [email or web address] Date: 08/09/2023 Prepared by: Susanna Epley  Exercises - Seated Assisted Cervical Rotation with Towel  - 1-2 x daily - 7 x weekly - 10 reps - Supine Cervical Sidebending Stretch  - 1-2 x daily - 7 x weekly - 3 reps - 30 sec hold  ASSESSMENT:  CLINICAL IMPRESSION: Pt reported improved stiffness in R shoulder at end of the session. Pt reported mild improvement in numbness in R thumb at end of the session.  OBJECTIVE IMPAIRMENTS: decreased ROM, hypomobility, impaired flexibility, postural dysfunction, and pain.   ACTIVITY LIMITATIONS: sleeping and driving  PARTICIPATION LIMITATIONS: cleaning, driving, shopping, and yard work  PERSONAL FACTORS: Age and Time since onset of injury/illness/exacerbation are also affecting patient's functional outcome.   REHAB POTENTIAL: Good  CLINICAL DECISION MAKING: Stable/uncomplicated  EVALUATION COMPLEXITY: Low   GOALS: Goals reviewed with patient? Yes  SHORT TERM GOALS= Long term goals (POC<4 weeks)   LONG TERM GOALS: Target date:  09/07/2023    Patient will demo at least 70 deg of cervical rotation without pain  to improve ability to turn head when driving.  Baseline: 45 deg Right, 50 deg Left Goal status: INITIAL  2.  Pt will report of pain <2/10 in her neck/shoulders at worst to improve overall function. Baseline: 5/10 Goal status: INITIAL  3.  Pt will be I and compliant with HEP to self manage her symptoms to improve overall function and management of her symptoms.  Baseline: Initiated at eval Goal status: INITIAL    PLAN:  PT FREQUENCY: 2x/week  PT DURATION: 4 weeks  PLANNED INTERVENTIONS: 97164- PT Re-evaluation, 97750- Physical Performance Testing, 97110-Therapeutic exercises, 97530- Therapeutic activity, [id number]- Neuromuscular re-education, 97535- Self Care, [address]109- Manual therapy, G0283- Electrical stimulation (unattended), Patient/Family education, Joint mobilization, Spinal mobilization, Cryotherapy, and Moist heat  PLAN FOR NEXT SESSION: Continue to progress HEP   Kristine Phalen, PT 08/22/2023, 11:09 AM

## 2023-08-24 ENCOUNTER — Ambulatory Visit

## 2023-08-24 DIAGNOSIS — M5412 Radiculopathy, cervical region: Secondary | ICD-10-CM

## 2023-08-24 DIAGNOSIS — R200 Anesthesia of skin: Secondary | ICD-10-CM | POA: Diagnosis not present

## 2023-08-24 DIAGNOSIS — M542 Cervicalgia: Secondary | ICD-10-CM

## 2023-08-24 NOTE — Therapy (Signed)
 OUTPATIENT PHYSICAL THERAPY CERVICAL TREATMENT NOTE   Patient Name: Ashley Rogers MRN: [id number] DOB:Aug 28, 1940, 83 y.o., female Today's Date: 08/24/2023  END OF SESSION:  PT End of Session - 08/24/23 1118     Visit Number 4    Number of Visits 9    Date for PT Re-Evaluation 09/06/23    PT Start Time 1025    PT Stop Time 1110    PT Time Calculation (min) 45 min    Activity Tolerance Patient tolerated treatment well    Behavior During Therapy [facility] for tasks assessed/performed             Past Medical History:  Diagnosis Date   Hypertension    RLS (restless legs syndrome)    Vertigo    Past Surgical History:  Procedure Laterality Date   TONSILLECTOMY     Patient Active Problem List   Diagnosis Date Noted   Carpal tunnel syndrome of right wrist 04/26/2019   Muscle cramp 04/26/2019    PCP: Dr. Rochelle Chu  REFERRING PROVIDER: Bleu Minerd, Donika K, DO  REFERRING DIAG: [phone] (ICD-10-CM) - Cervical radiculopathy M54.2 (ICD-10-CM) - Cervicalgia R20.0 (ICD-10-CM) - Numbness of right hand  THERAPY DIAG:  Cervical radiculopathy  Cervicalgia  Numbness of right hand  Rationale for Evaluation and Treatment: Rehabilitation  ONSET DATE: 07/31/2023  SUBJECTIVE:                                                                                                                                                                                                         SUBJECTIVE STATEMENT: Pt reports she felt better for couple of days after last time. She is not having so much pain in the back of th ehead since last session but she is feeling stiffness, achyness in both of shoulder and constant numbness in thumb to ring finger. Pinky finger feels normal.  Hand dominance: Right  PERTINENT HISTORY:  Vertigo, HTN  PAIN:  Are you having pain? Yes: NPRS scale: 8/10 Pain location: neck, R upper trap, L shoulder Pain description: achy, tender, radiating Aggravating factors:  turning head Relieving factors: none  PRECAUTIONS: None  RED FLAGS: None     WEIGHT BEARING RESTRICTIONS: No  FALLS:  Has patient fallen in last 6 months? No   PLOF: Independent  PATIENT GOALS: improve pain   OBJECTIVE:  Note: Objective measures were completed at Evaluation unless otherwise noted.  DIAGNOSTIC FINDINGS:  CT head 07/20/2023 1. No acute intracranial pathology. 2. Mild age-related atrophy and chronic microvascular ischemic changes.   NCS/EMG of the right arm  05/24/2018: Right median neuropathy at or distal to the wrist, consistent with a clinical diagnosis of carpal tunnel syndrome.  Overall, these findings are at least moderate in degree electrically. Chronic C6 radiculopathy affecting the right upper extremity, mild in degree electrically.     CERVICAL ROM:   Active ROM A/PROM (deg) eval  Flexion 55  Extension 35  Right lateral flexion   Left lateral flexion   Right rotation 45  Left rotation 50   (Blank rows = not tested)  UPPER EXTREMITY ROM: WFL bil with flexion, abduction, ER and IR  Active ROM Right eval Left eval  Shoulder flexion    Shoulder extension    Shoulder abduction    Shoulder adduction    Shoulder extension    Shoulder internal rotation    Shoulder external rotation    Elbow flexion    Elbow extension    Wrist flexion    Wrist extension    Wrist ulnar deviation    Wrist radial deviation    Wrist pronation    Wrist supination     (Blank rows = not tested)  UPPER EXTREMITY MMT:  MMT Right eval Left eval  Shoulder flexion 5/5 5/5  Shoulder extension    Shoulder abduction 5/5 5/5  Shoulder adduction    Shoulder extension    Shoulder internal rotation 5/5 5/5  Shoulder external rotation 5/5 5/5  Middle trapezius    Lower trapezius    Elbow flexion 5/5 5/5  Elbow extension 5/5 5/5  Wrist flexion    Wrist extension    Wrist ulnar deviation    Wrist radial deviation    Wrist pronation    Wrist supination     Grip strength 35 lbs 32 lbs   (Blank rows = not tested)    TREATMENT DATE:  08/24/23 Manual therapy: Soft tissue massage to bil upper traps, levator scapulae, scalenes, cervical paraspinalis Grade IV 1st rib inferior and PA mobilization- R only Upper troacic extension mobilization in sitting.   TherEx: Seated cervical rotaiton with overpressure: 15x R and L, required multiple verbal and tactile cue with demonstrations to perform it properly   PATIENT EDUCATION:  Education details: see above Person educated: Patient Education method: Explanation Education comprehension: verbalized understanding  HOME EXERCISE PROGRAM: Access Code: [email or web address] URL: [email or web address] Date: 08/09/2023 Prepared by: Susanna Epley  Exercises - Seated Assisted Cervical Rotation with Towel  - 1-2 x daily - 7 x weekly - 10 reps - Supine Cervical Sidebending Stretch  - 1-2 x daily - 7 x weekly - 3 reps - 30 sec hold  ASSESSMENT:  CLINICAL IMPRESSION: Overall cervical rotation is limited significantly despite manual therapy. Pt is reporting mild improvement in pain overall but stiffness continues to be primary impairment for patient.   OBJECTIVE IMPAIRMENTS: decreased ROM, hypomobility, impaired flexibility, postural dysfunction, and pain.   ACTIVITY LIMITATIONS: sleeping and driving  PARTICIPATION LIMITATIONS: cleaning, driving, shopping, and yard work  PERSONAL FACTORS: Age and Time since onset of injury/illness/exacerbation are also affecting patient's functional outcome.   REHAB POTENTIAL: Good  CLINICAL DECISION MAKING: Stable/uncomplicated  EVALUATION COMPLEXITY: Low   GOALS: Goals reviewed with patient? Yes  SHORT TERM GOALS= Long term goals (POC<4 weeks)   LONG TERM GOALS: Target date: 09/07/2023    Patient will demo at least 70 deg of cervical rotation without pain to improve ability to turn head when driving.  Baseline: 45 deg Right, 50 deg Left Goal  status: INITIAL  2.  Pt will report of pain <  2/10 in her neck/shoulders at worst to improve overall function. Baseline: 5/10 Goal status: INITIAL  3.  Pt will be I and compliant with HEP to self manage her symptoms to improve overall function and management of her symptoms.  Baseline: Initiated at eval Goal status: INITIAL    PLAN:  PT FREQUENCY: 2x/week  PT DURATION: 4 weeks  PLANNED INTERVENTIONS: 97164- PT Re-evaluation, 97750- Physical Performance Testing, 97110-Therapeutic exercises, 97530- Therapeutic activity, [id number]- Neuromuscular re-education, 97535- Self Care, 09811- Manual therapy, G0283- Electrical stimulation (unattended), Patient/Family education, Joint mobilization, Spinal mobilization, Cryotherapy, and Moist heat  PLAN FOR NEXT SESSION: Continue to progress HEP   Kristine Phalen, PT 08/24/2023, 11:19 AM

## 2023-08-25 ENCOUNTER — Ambulatory Visit: Admitting: Neurology

## 2023-08-25 DIAGNOSIS — M542 Cervicalgia: Secondary | ICD-10-CM

## 2023-08-25 DIAGNOSIS — G5601 Carpal tunnel syndrome, right upper limb: Secondary | ICD-10-CM

## 2023-08-25 DIAGNOSIS — M5412 Radiculopathy, cervical region: Secondary | ICD-10-CM | POA: Diagnosis not present

## 2023-08-25 NOTE — Progress Notes (Signed)
    Follow-up Visit   Date: 08/25/2023    DUSTY LEE MRN: [id number] DOB: 1940/10/11    Ashley Rogers is a 83 y.o. right-handed female with hypertension and RLS returning to the clinic for follow-up of right hand paresthesias.  The patient was accompanied to the clinic by self.  IMPRESSION/PLAN: Right carpal tunnel syndrome, severe.   - Management options discussed including using a wrist splint at night time.  If no improvement, we can refer for steroid injection to the wrist.  She is no interested in surgery.   2.  Right cervical radiculopathy.  Neck pain has improved with PT  - Continue PT  - If symptoms get worse, MRI cervical spine will be the next step  Return to clinic in 3 months  --------------------------------------------- History of present illness: Starting earlier this year, she developed numbness involving the right hand, worse over the fingertips.  Symptoms are constant.  Preceding this, she felt a pop in the shoulder.  She also has achy pain over the base of the right side of her head.  She tried NSAIDs for severe pain, which helped.    UPDATE 08/25/2023:  She is here for EDX of the hands.  She has been doing PT which has alleviated the severe pain she was having at the base of her head.  She continues to have numbness in the right hand.    Medications:  Current Outpatient Medications on File Prior to Visit  Medication Sig Dispense Refill   amLODipine (NORVASC) 2.5 MG tablet Take 2.5 mg by mouth daily.     ibuprofen (ADVIL,MOTRIN) 200 MG tablet Take 800 mg by mouth every 6 (six) hours as needed for moderate pain.     Magnesium Citrate (MAGNESIUM GUMMIES PO) Take by mouth.     Multiple Vitamins-Minerals (CENTRUM PO) Take 1 tablet by mouth daily.     olmesartan (BENICAR) 40 MG tablet Take 40 mg by mouth daily.     oxybutynin (DITROPAN-XL) 10 MG 24 hr tablet Take 10 mg by mouth daily.     tiZANidine  (ZANAFLEX ) 2 MG tablet Take 1 tablet (2 mg total) by mouth  at bedtime as needed for muscle spasms (neck pain). 30 tablet 4   No current facility-administered medications on file prior to visit.    Allergies:  Allergies  Allergen Reactions   Codeine Nausea And Vomiting    Vital Signs:  There were no vitals taken for this visit.   Neurological Exam: MENTAL STATUS including orientation to time, place, person, recent and remote memory, attention span and concentration, language, and fund of knowledge is normal.  Speech is not dysarthric.  CRANIAL NERVES:   Normal conjugate, extra-ocular eye movements in all directions of gaze.  No ptosis.  Face is symmetric.   MOTOR:  Motor strength is 5/5 in all extremities, except right ABP is 4/5.  COORDINATION/GAIT:  Gait narrow based and stable.   Data: NCS/EMG of the right arm 08/25/2023: Right median neuropathy at or distal to the wrist, consistent with a clinical diagnosis of carpal tunnel syndrome.  Overall, these findings are severe in degree electrically. Chronic C5-6 radiculopathy affecting the right upper extremity, mild.   Thank you for allowing me to participate in patient's care.  If I can answer any additional questions, I would be pleased to do so.    Sincerely,    Jannelle Notaro K. Lydia Sams, DO

## 2023-08-25 NOTE — Procedures (Signed)
  [facility]  [address] [place], [address]  [place], [place] [address] Tel: [phone] Fax: [phone] Test Date:  08/25/2023  Patient: Ashley Rogers DOB: [birth year] Physician: Reyna Cava, DO  Sex: Female Height: 5\' 4"  Ref Phys: Reyna Cava, DO  ID#: [id number]   Technician:    History: This is a 83 year old female referred for evaluation of right arm paresthesias.  NCV & EMG Findings: Extensive electrodiagnostic testing of the right upper extremity shows:  Right median sensory response is absent.  Right ulnar sensory response is within normal limits. Right median motor response shows severely prolonged latency (7.7 ms) and reduced amplitude (1.8 mV).  Right ulnar motor response is within normal limits.   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    Impression: Right median neuropathy at or distal to the wrist, consistent with a clinical diagnosis of carpal tunnel syndrome.  Overall, these findings are severe in degree electrically. Chronic C5-6 radiculopathy affecting the right upper extremity, mild.   ___________________________ Reyna Cava, DO    Nerve Conduction Studies   Stim Site NR Peak (ms) Norm Peak (ms) O-P Amp (V) Norm O-P Amp  Right Median Anti Sensory (2nd Digit)  32 C  Wrist *NR  <3.8  >10  Right Ulnar Anti Sensory (5th Digit)  32 C  Wrist    2.8 <3.2 18.9 >5     Stim Site NR Onset (ms) Norm Onset (ms) O-P Amp (mV) Norm O-P Amp Site1 Site2 Delta-0 (ms) Dist (cm) Vel (m/s) Norm Vel (m/s)  Right Median Motor (Abd Poll Brev)  32 C  Wrist    *7.7 <4.0 *1.8 >5 Elbow Wrist 5.2 29.0 56 >50  Elbow    12.9  1.7         Right Ulnar Motor (Abd Dig Minimi)  32 C  Wrist    2.2 <3.1 9.2 >7 B Elbow Wrist 3.9 21.0 54 >50  B Elbow    6.1  8.6  A Elbow B Elbow 1.7 10.0 59 >50  A Elbow    7.8  7.5          Electromyography   Side Muscle Ins.Act Fibs  Fasc Recrt Amp Dur Poly Activation Comment  Right 1stDorInt Nml Nml Nml Nml Nml Nml Nml Nml N/A  Right Abd Poll Brev Nml Nml Nml *3- *1+ *1+ *1+ Nml N/A  Right PronatorTeres Nml Nml Nml Nml Nml Nml Nml Nml N/A  Right Biceps Nml Nml Nml *1- *1+ *1+ *1+ Nml N/A  Right Triceps Nml Nml Nml Nml Nml Nml Nml Nml N/A  Right Deltoid Nml Nml Nml *1- *1+ *1+ *1+ Nml N/A      Waveforms:

## 2023-08-28 ENCOUNTER — Telehealth: Payer: Self-pay

## 2023-08-28 ENCOUNTER — Ambulatory Visit

## 2023-08-28 DIAGNOSIS — R200 Anesthesia of skin: Secondary | ICD-10-CM | POA: Diagnosis not present

## 2023-08-28 DIAGNOSIS — M5412 Radiculopathy, cervical region: Secondary | ICD-10-CM | POA: Diagnosis not present

## 2023-08-28 DIAGNOSIS — M542 Cervicalgia: Secondary | ICD-10-CM

## 2023-08-28 NOTE — Telephone Encounter (Signed)
 Patient Name: Ashley Rogers MRN: [id number] DOB:[birth year], 83 y.o., female Today's Date: 08/28/2023  Pt missed her PT appt today at 11am. Patient was called and left voice mail at 11:35am to notify her of her missed appt and to remind her of her next upcoming PT appt.    Kristine Phalen, PT 08/28/2023, 11:39 AM

## 2023-08-28 NOTE — Therapy (Signed)
 OUTPATIENT PHYSICAL THERAPY CERVICAL TREATMENT NOTE   Patient Name: Ashley Rogers MRN: [id number] DOB:1940-07-02, 83 y.o., female Today's Date: 08/28/2023  END OF SESSION:  PT End of Session - 08/28/23 1233     Visit Number 5    Number of Visits 9    Date for PT Re-Evaluation 09/06/23    PT Start Time 1150    PT Stop Time 1230    PT Time Calculation (min) 40 min    Activity Tolerance Patient tolerated treatment well    Behavior During Therapy [facility] for tasks assessed/performed             Past Medical History:  Diagnosis Date   Hypertension    RLS (restless legs syndrome)    Vertigo    Past Surgical History:  Procedure Laterality Date   TONSILLECTOMY     Patient Active Problem List   Diagnosis Date Noted   Carpal tunnel syndrome of right wrist 04/26/2019   Muscle cramp 04/26/2019    PCP: Dr. Rochelle Chu  REFERRING PROVIDER: Dodger Sinning, Donika K, DO  REFERRING DIAG: [phone] (ICD-10-CM) - Cervical radiculopathy M54.2 (ICD-10-CM) - Cervicalgia R20.0 (ICD-10-CM) - Numbness of right hand  THERAPY DIAG:  Cervical radiculopathy  Cervicalgia  Numbness of right hand  Rationale for Evaluation and Treatment: Rehabilitation  ONSET DATE: 07/31/2023  SUBJECTIVE:                                                                                                                                                                                                         SUBJECTIVE STATEMENT: Doing okay, I got lost with my GPS and so that is why I was so late.  Hand dominance: Right  PERTINENT HISTORY:  Vertigo, HTN  PAIN:  Are you having pain? Yes: NPRS scale: 6/10 Pain location: neck, R upper trap, L shoulder Pain description: achy, tender, radiating Aggravating factors: turning head Relieving factors: none  PRECAUTIONS: None  RED FLAGS: None     WEIGHT BEARING RESTRICTIONS: No  FALLS:  Has patient fallen in last 6 months? No   PLOF: Independent  PATIENT  GOALS: improve pain   OBJECTIVE:  Note: Objective measures were completed at Evaluation unless otherwise noted.  DIAGNOSTIC FINDINGS:  CT head 07/20/2023 1. No acute intracranial pathology. 2. Mild age-related atrophy and chronic microvascular ischemic changes.   NCS/EMG of the right arm 05/24/2018: Right median neuropathy at or distal to the wrist, consistent with a clinical diagnosis of carpal tunnel syndrome.  Overall, these findings are at least moderate in degree electrically. Chronic C6 radiculopathy  affecting the right upper extremity, mild in degree electrically.     CERVICAL ROM:   Active ROM A/PROM (deg) eval  Flexion 55  Extension 35  Right lateral flexion   Left lateral flexion   Right rotation 45  Left rotation 50   (Blank rows = not tested)  UPPER EXTREMITY ROM: WFL bil with flexion, abduction, ER and IR  Active ROM Right eval Left eval  Shoulder flexion    Shoulder extension    Shoulder abduction    Shoulder adduction    Shoulder extension    Shoulder internal rotation    Shoulder external rotation    Elbow flexion    Elbow extension    Wrist flexion    Wrist extension    Wrist ulnar deviation    Wrist radial deviation    Wrist pronation    Wrist supination     (Blank rows = not tested)  UPPER EXTREMITY MMT:  MMT Right eval Left eval  Shoulder flexion 5/5 5/5  Shoulder extension    Shoulder abduction 5/5 5/5  Shoulder adduction    Shoulder extension    Shoulder internal rotation 5/5 5/5  Shoulder external rotation 5/5 5/5  Middle trapezius    Lower trapezius    Elbow flexion 5/5 5/5  Elbow extension 5/5 5/5  Wrist flexion    Wrist extension    Wrist ulnar deviation    Wrist radial deviation    Wrist pronation    Wrist supination    Grip strength 35 lbs 32 lbs   (Blank rows = not tested)    TREATMENT DATE:  08/24/23 Manual therapy: Soft tissue massage to bil upper traps, levator scapulae, scalenes, cervical  paraspinalis Manually stretched bil upper trap, posterior and middle scalenes   TherEx: Pt educated on performing lateral flexion stretch, cervical rotation stretch with towle and using hot compress if it helps at home until next session. Pt educated on discussing Botox for neck muscles with her physician to see if it can help with her overall stiffness and pain in her neck  PATIENT EDUCATION:  Education details: see above Person educated: Patient Education method: Explanation Education comprehension: verbalized understanding  HOME EXERCISE PROGRAM: Access Code: [email or web address] URL: [email or web address] Date: 08/09/2023 Prepared by: Susanna Epley  Exercises - Seated Assisted Cervical Rotation with Towel  - 1-2 x daily - 7 x weekly - 10 reps - Supine Cervical Sidebending Stretch  - 1-2 x daily - 7 x weekly - 3 reps - 30 sec hold  ASSESSMENT:  CLINICAL IMPRESSION: Pt reported improved pain/stiffness with end range of rotation bil  OBJECTIVE IMPAIRMENTS: decreased ROM, hypomobility, impaired flexibility, postural dysfunction, and pain.   ACTIVITY LIMITATIONS: sleeping and driving  PARTICIPATION LIMITATIONS: cleaning, driving, shopping, and yard work  PERSONAL FACTORS: Age and Time since onset of injury/illness/exacerbation are also affecting patient's functional outcome.   REHAB POTENTIAL: Good  CLINICAL DECISION MAKING: Stable/uncomplicated  EVALUATION COMPLEXITY: Low   GOALS: Goals reviewed with patient? Yes  SHORT TERM GOALS= Long term goals (POC<4 weeks)   LONG TERM GOALS: Target date: 09/07/2023    Patient will demo at least 70 deg of cervical rotation without pain to improve ability to turn head when driving.  Baseline: 45 deg Right, 50 deg Left Goal status: INITIAL  2.  Pt will report of pain <2/10 in her neck/shoulders at worst to improve overall function. Baseline: 5/10 Goal status: INITIAL  3.  Pt will be I and compliant with HEP to self  manage  her symptoms to improve overall function and management of her symptoms.  Baseline: Initiated at eval Goal status: INITIAL    PLAN:  PT FREQUENCY: 2x/week  PT DURATION: 4 weeks  PLANNED INTERVENTIONS: 97164- PT Re-evaluation, 97750- Physical Performance Testing, 97110-Therapeutic exercises, 97530- Therapeutic activity, [id number]- Neuromuscular re-education, 97535- Self Care, [id number]- Manual therapy, [id number]- Electrical stimulation (unattended), Patient/Family education, Joint mobilization, Spinal mobilization, Cryotherapy, and Moist heat  PLAN FOR NEXT SESSION: Continue to progress HEP   Kristine Phalen, PT 08/28/2023, 12:35 PM

## 2023-08-30 ENCOUNTER — Ambulatory Visit

## 2023-08-30 DIAGNOSIS — M5412 Radiculopathy, cervical region: Secondary | ICD-10-CM | POA: Diagnosis not present

## 2023-08-30 DIAGNOSIS — R200 Anesthesia of skin: Secondary | ICD-10-CM | POA: Diagnosis not present

## 2023-08-30 DIAGNOSIS — M542 Cervicalgia: Secondary | ICD-10-CM | POA: Diagnosis not present

## 2023-08-30 NOTE — Therapy (Signed)
 OUTPATIENT PHYSICAL THERAPY CERVICAL TREATMENT NOTE   Patient Name: Ashley Rogers MRN: [id number] DOB:16-Jul-1940, 83 y.o., female Today's Date: 08/30/2023  END OF SESSION:  PT End of Session - 08/30/23 1104     Visit Number 6    Number of Visits 9    Date for PT Re-Evaluation 09/06/23    PT Start Time 1100    PT Stop Time 1145    PT Time Calculation (min) 45 min    Activity Tolerance Patient tolerated treatment well    Behavior During Therapy [facility] for tasks assessed/performed             Past Medical History:  Diagnosis Date   Hypertension    RLS (restless legs syndrome)    Vertigo    Past Surgical History:  Procedure Laterality Date   TONSILLECTOMY     Patient Active Problem List   Diagnosis Date Noted   Carpal tunnel syndrome of right wrist 04/26/2019   Muscle cramp 04/26/2019    PCP: Dr. Rochelle Chu  REFERRING PROVIDER: Megumi Treaster, Donika K, DO  REFERRING DIAG: [phone] (ICD-10-CM) - Cervical radiculopathy M54.2 (ICD-10-CM) - Cervicalgia R20.0 (ICD-10-CM) - Numbness of right hand  THERAPY DIAG:  Cervical radiculopathy  Cervicalgia  Numbness of right hand  Rationale for Evaluation and Treatment: Rehabilitation  ONSET DATE: 07/31/2023  SUBJECTIVE:                                                                                                                                                                                                         SUBJECTIVE STATEMENT: Pt reports symptoms in the neck and R UE are about the same.   Hand dominance: Right  PERTINENT HISTORY:  Vertigo, HTN  PAIN:  Are you having pain? Yes: NPRS scale: 6/10 Pain location: neck, R upper trap, L shoulder Pain description: achy, tender, radiating Aggravating factors: turning head Relieving factors: none  PRECAUTIONS: None  RED FLAGS: None     WEIGHT BEARING RESTRICTIONS: No  FALLS:  Has patient fallen in last 6 months? No   PLOF: Independent  PATIENT GOALS:  improve pain   OBJECTIVE:  Note: Objective measures were completed at Evaluation unless otherwise noted.  DIAGNOSTIC FINDINGS:  CT head 07/20/2023 1. No acute intracranial pathology. 2. Mild age-related atrophy and chronic microvascular ischemic changes.   NCS/EMG of the right arm 05/24/2018: Right median neuropathy at or distal to the wrist, consistent with a clinical diagnosis of carpal tunnel syndrome.  Overall, these findings are at least moderate in degree electrically. Chronic C6 radiculopathy affecting the right  upper extremity, mild in degree electrically.     CERVICAL ROM:   Active ROM A/PROM (deg) eval AROM 08/30/23  Flexion 55 60  Extension 35 55  Right lateral flexion    Left lateral flexion    Right rotation 45 45  Left rotation 50 45   (Blank rows = not tested)  UPPER EXTREMITY ROM: WFL bil with flexion, abduction, ER and IR  Active ROM Right eval Left eval  Shoulder flexion    Shoulder extension    Shoulder abduction    Shoulder adduction    Shoulder extension    Shoulder internal rotation    Shoulder external rotation    Elbow flexion    Elbow extension    Wrist flexion    Wrist extension    Wrist ulnar deviation    Wrist radial deviation    Wrist pronation    Wrist supination     (Blank rows = not tested)  UPPER EXTREMITY MMT:  MMT Right eval Left eval  Shoulder flexion 5/5 5/5  Shoulder extension    Shoulder abduction 5/5 5/5  Shoulder adduction    Shoulder extension    Shoulder internal rotation 5/5 5/5  Shoulder external rotation 5/5 5/5  Middle trapezius    Lower trapezius    Elbow flexion 5/5 5/5  Elbow extension 5/5 5/5  Wrist flexion    Wrist extension    Wrist ulnar deviation    Wrist radial deviation    Wrist pronation    Wrist supination    Grip strength 35 lbs 32 lbs   (Blank rows = not tested)    TREATMENT DATE:  08/30/23 Manual therapy: Soft tissue massage to bil upper traps, levator scapulae, scalenes,  cervical paraspinalis Manually stretched bil upper trap, posterior and middle scalenes Myofascial release to R and L upper trap   TherEx: Lateral flexion stretch with micro adjustment with cervical flexion and rotation to get to stiffest part of the stretch: 3 x 30" R and L another 3 x 30" after a bout of some more manual therapy Resisted lateral flexion: seated: yellow band: slow and controlled: 10x R and L   PATIENT EDUCATION:  Education details: see above Person educated: Patient Education method: Explanation Education comprehension: verbalized understanding  HOME EXERCISE PROGRAM: Access Code: [email or web address] URL: [email or web address] Date: 08/09/2023 Prepared by: Susanna Epley  Exercises - Seated Assisted Cervical Rotation with Towel  - 1-2 x daily - 7 x weekly - 10 reps - Supine Cervical Sidebending Stretch  - 1-2 x daily - 7 x weekly - 3 reps - 30 sec hold  ASSESSMENT:  CLINICAL IMPRESSION: Pt reported improved pain/stiffness with end range of rotation bil. Overal rotation AROM has not improved significantly but demo improvement in flexion and extension ROM.  OBJECTIVE IMPAIRMENTS: decreased ROM, hypomobility, impaired flexibility, postural dysfunction, and pain.   ACTIVITY LIMITATIONS: sleeping and driving  PARTICIPATION LIMITATIONS: cleaning, driving, shopping, and yard work  PERSONAL FACTORS: Age and Time since onset of injury/illness/exacerbation are also affecting patient's functional outcome.   REHAB POTENTIAL: Good  CLINICAL DECISION MAKING: Stable/uncomplicated  EVALUATION COMPLEXITY: Low   GOALS: Goals reviewed with patient? Yes  SHORT TERM GOALS= Long term goals (POC<4 weeks)   LONG TERM GOALS: Target date: 09/07/2023    Patient will demo at least 70 deg of cervical rotation without pain to improve ability to turn head when driving.  Baseline: 45 deg Right, 50 deg Left Goal status: INITIAL  2.  Pt will report of pain <2/10  in her  neck/shoulders at worst to improve overall function. Baseline: 5/10 Goal status: INITIAL  3.  Pt will be I and compliant with HEP to self manage her symptoms to improve overall function and management of her symptoms.  Baseline: Initiated at eval Goal status: INITIAL    PLAN:  PT FREQUENCY: 2x/week  PT DURATION: 4 weeks  PLANNED INTERVENTIONS: 97164- PT Re-evaluation, 97750- Physical Performance Testing, 97110-Therapeutic exercises, 97530- Therapeutic activity, [id number]- Neuromuscular re-education, 97535- Self Care, [id number]- Manual therapy, G0283- Electrical stimulation (unattended), Patient/Family education, Joint mobilization, Spinal mobilization, Cryotherapy, and Moist heat  PLAN FOR NEXT SESSION: Continue to progress HEP   Kristine Phalen, PT 08/30/2023, 11:06 AM

## 2023-09-04 ENCOUNTER — Ambulatory Visit

## 2023-09-06 ENCOUNTER — Ambulatory Visit

## 2023-09-13 ENCOUNTER — Ambulatory Visit

## 2023-09-13 DIAGNOSIS — M5412 Radiculopathy, cervical region: Secondary | ICD-10-CM

## 2023-09-13 DIAGNOSIS — R200 Anesthesia of skin: Secondary | ICD-10-CM

## 2023-09-13 DIAGNOSIS — M542 Cervicalgia: Secondary | ICD-10-CM | POA: Diagnosis not present

## 2023-09-13 NOTE — Therapy (Signed)
 OUTPATIENT PHYSICAL THERAPY CERVICAL TREATMENT NOTE   Patient Name: Ashley Rogers MRN: [id number] DOB:05-28-1940, 83 y.o., female Today's Date: 09/13/2023  END OF SESSION:  PT End of Session - 09/13/23 1104     Visit Number 7    Number of Visits 9    Date for PT Re-Evaluation 09/06/23    PT Start Time 1020    PT Stop Time 1100    PT Time Calculation (min) 40 min    Activity Tolerance Patient tolerated treatment well    Behavior During Therapy [facility] for tasks assessed/performed             Past Medical History:  Diagnosis Date   Hypertension    RLS (restless legs syndrome)    Vertigo    Past Surgical History:  Procedure Laterality Date   TONSILLECTOMY     Patient Active Problem List   Diagnosis Date Noted   Carpal tunnel syndrome of right wrist 04/26/2019   Muscle cramp 04/26/2019    PCP: Dr. Rochelle Chu  REFERRING PROVIDER: Wagner Tanzi, Donika K, DO  REFERRING DIAG: [phone] (ICD-10-CM) - Cervical radiculopathy M54.2 (ICD-10-CM) - Cervicalgia R20.0 (ICD-10-CM) - Numbness of right hand  THERAPY DIAG:  Cervical radiculopathy  Cervicalgia  Numbness of right hand  Rationale for Evaluation and Treatment: Rehabilitation  ONSET DATE: 07/31/2023  SUBJECTIVE:                                                                                                                                                                                                         SUBJECTIVE STATEMENT: Pt reports symptoms in the neck and R UE are about the same.   Hand dominance: Right  PERTINENT HISTORY:  Vertigo, HTN  PAIN:  Are you having pain? Yes: NPRS scale: 6/10 Pain location: neck, R upper trap, L shoulder Pain description: achy, tender, radiating Aggravating factors: turning head Relieving factors: none  PRECAUTIONS: None  RED FLAGS: None     WEIGHT BEARING RESTRICTIONS: No  FALLS:  Has patient fallen in last 6 months? No   PLOF: Independent  PATIENT GOALS:  improve pain   OBJECTIVE:  Note: Objective measures were completed at Evaluation unless otherwise noted.  DIAGNOSTIC FINDINGS:  CT head 07/20/2023 1. No acute intracranial pathology. 2. Mild age-related atrophy and chronic microvascular ischemic changes.   NCS/EMG of the right arm 05/24/2018: Right median neuropathy at or distal to the wrist, consistent with a clinical diagnosis of carpal tunnel syndrome.  Overall, these findings are at least moderate in degree electrically. Chronic C6 radiculopathy affecting the right  upper extremity, mild in degree electrically.     CERVICAL ROM:   Active ROM A/PROM (deg) eval AROM 08/30/23  Flexion 55 60  Extension 35 55  Right lateral flexion    Left lateral flexion    Right rotation 45 45  Left rotation 50 45   (Blank rows = not tested)  UPPER EXTREMITY ROM: WFL bil with flexion, abduction, ER and IR  Active ROM Right eval Left eval  Shoulder flexion    Shoulder extension    Shoulder abduction    Shoulder adduction    Shoulder extension    Shoulder internal rotation    Shoulder external rotation    Elbow flexion    Elbow extension    Wrist flexion    Wrist extension    Wrist ulnar deviation    Wrist radial deviation    Wrist pronation    Wrist supination     (Blank rows = not tested)  UPPER EXTREMITY MMT:  MMT Right eval Left eval  Shoulder flexion 5/5 5/5  Shoulder extension    Shoulder abduction 5/5 5/5  Shoulder adduction    Shoulder extension    Shoulder internal rotation 5/5 5/5  Shoulder external rotation 5/5 5/5  Middle trapezius    Lower trapezius    Elbow flexion 5/5 5/5  Elbow extension 5/5 5/5  Wrist flexion    Wrist extension    Wrist ulnar deviation    Wrist radial deviation    Wrist pronation    Wrist supination    Grip strength 35 lbs 32 lbs   (Blank rows = not tested)    TREATMENT DATE:  09/13/23 Manual therapy: Soft tissue massage to bil upper traps, levator scapulae, scalenes,  cervical paraspinalis Manually stretched bil upper trap, posterior and middle scalenes Myofascial release to R and L upper trap   TherEx: Lateral flexion stretch with micro adjustment with cervical flexion and rotation to get to stiffest part of the stretch: 3 x 30" R and L another 3 x 30" after a bout of some more manual therapy    PATIENT EDUCATION:  Education details: see above Person educated: Patient Education method: Explanation Education comprehension: verbalized understanding  HOME EXERCISE PROGRAM: Access Code: [email or web address] URL: [email or web address] Date: 08/09/2023 Prepared by: Susanna Epley  Exercises - Seated Assisted Cervical Rotation with Towel  - 1-2 x daily - 7 x weekly - 10 reps - Supine Cervical Sidebending Stretch  - 1-2 x daily - 7 x weekly - 3 reps - 30 sec hold  ASSESSMENT:  CLINICAL IMPRESSION: Pt reported improved pain/stiffness with end range of rotation bil. Overall L rotation is limited compared to R with pain localized in R side of neck but stiffness more evident in left side of neck/shoulder  OBJECTIVE IMPAIRMENTS: decreased ROM, hypomobility, impaired flexibility, postural dysfunction, and pain.   ACTIVITY LIMITATIONS: sleeping and driving  PARTICIPATION LIMITATIONS: cleaning, driving, shopping, and yard work  PERSONAL FACTORS: Age and Time since onset of injury/illness/exacerbation are also affecting patient's functional outcome.   REHAB POTENTIAL: Good  CLINICAL DECISION MAKING: Stable/uncomplicated  EVALUATION COMPLEXITY: Low   GOALS: Goals reviewed with patient? Yes  SHORT TERM GOALS= Long term goals (POC<4 weeks)   LONG TERM GOALS: Target date: 09/07/2023    Patient will demo at least 70 deg of cervical rotation without pain to improve ability to turn head when driving.  Baseline: 45 deg Right, 50 deg Left Goal status: INITIAL  2.  Pt will report of pain <2/10 in her  neck/shoulders at worst to improve overall  function. Baseline: 5/10 Goal status: INITIAL  3.  Pt will be I and compliant with HEP to self manage her symptoms to improve overall function and management of her symptoms.  Baseline: Initiated at eval Goal status: INITIAL    PLAN:  PT FREQUENCY: 2x/week  PT DURATION: 4 weeks  PLANNED INTERVENTIONS: 97164- PT Re-evaluation, 97750- Physical Performance Testing, 97110-Therapeutic exercises, 97530- Therapeutic activity, [id number]- Neuromuscular re-education, 97535- Self Care, 78295- Manual therapy, G0283- Electrical stimulation (unattended), Patient/Family education, Joint mobilization, Spinal mobilization, Cryotherapy, and Moist heat  PLAN FOR NEXT SESSION: Continue to progress HEP   Kristine Phalen, PT 09/13/2023, 11:05 AM

## 2023-10-04 DIAGNOSIS — N958 Other specified menopausal and perimenopausal disorders: Secondary | ICD-10-CM | POA: Diagnosis not present

## 2023-10-04 DIAGNOSIS — N3941 Urge incontinence: Secondary | ICD-10-CM | POA: Diagnosis not present

## 2023-10-28 ENCOUNTER — Other Ambulatory Visit: Payer: Self-pay | Admitting: Neurology

## 2023-11-06 ENCOUNTER — Ambulatory Visit: Admitting: Neurology

## 2024-02-20 DIAGNOSIS — N182 Chronic kidney disease, stage 2 (mild): Secondary | ICD-10-CM | POA: Diagnosis not present

## 2024-02-20 DIAGNOSIS — Z823 Family history of stroke: Secondary | ICD-10-CM | POA: Diagnosis not present

## 2024-02-20 DIAGNOSIS — Z87891 Personal history of nicotine dependence: Secondary | ICD-10-CM | POA: Diagnosis not present

## 2024-02-20 DIAGNOSIS — H269 Unspecified cataract: Secondary | ICD-10-CM | POA: Diagnosis not present

## 2024-02-20 DIAGNOSIS — N3941 Urge incontinence: Secondary | ICD-10-CM | POA: Diagnosis not present

## 2024-02-20 DIAGNOSIS — Z809 Family history of malignant neoplasm, unspecified: Secondary | ICD-10-CM | POA: Diagnosis not present

## 2024-02-20 DIAGNOSIS — I129 Hypertensive chronic kidney disease with stage 1 through stage 4 chronic kidney disease, or unspecified chronic kidney disease: Secondary | ICD-10-CM | POA: Diagnosis not present

## 2024-02-20 DIAGNOSIS — Z8249 Family history of ischemic heart disease and other diseases of the circulatory system: Secondary | ICD-10-CM | POA: Diagnosis not present

## 2024-02-20 DIAGNOSIS — M1990 Unspecified osteoarthritis, unspecified site: Secondary | ICD-10-CM | POA: Diagnosis not present

## 2024-03-05 DIAGNOSIS — Z0000 Encounter for general adult medical examination without abnormal findings: Secondary | ICD-10-CM | POA: Diagnosis not present

## 2024-03-05 DIAGNOSIS — Z131 Encounter for screening for diabetes mellitus: Secondary | ICD-10-CM | POA: Diagnosis not present

## 2024-03-05 DIAGNOSIS — N3941 Urge incontinence: Secondary | ICD-10-CM | POA: Diagnosis not present

## 2024-03-05 DIAGNOSIS — Z136 Encounter for screening for cardiovascular disorders: Secondary | ICD-10-CM | POA: Diagnosis not present

## 2024-03-05 DIAGNOSIS — Z23 Encounter for immunization: Secondary | ICD-10-CM | POA: Diagnosis not present

## 2024-03-05 DIAGNOSIS — Z1329 Encounter for screening for other suspected endocrine disorder: Secondary | ICD-10-CM | POA: Diagnosis not present

## 2024-03-05 DIAGNOSIS — I10 Essential (primary) hypertension: Secondary | ICD-10-CM | POA: Diagnosis not present

## 2024-03-05 DIAGNOSIS — Z13220 Encounter for screening for lipoid disorders: Secondary | ICD-10-CM | POA: Diagnosis not present

## 2024-03-19 ENCOUNTER — Ambulatory Visit: Admitting: Neurology

## 2024-03-19 ENCOUNTER — Encounter: Payer: Self-pay | Admitting: Neurology

## 2024-03-19 VITALS — BP 139/81 | HR 86 | Ht 64.0 in | Wt 153.0 lb

## 2024-03-19 DIAGNOSIS — M5412 Radiculopathy, cervical region: Secondary | ICD-10-CM

## 2024-03-19 DIAGNOSIS — G5601 Carpal tunnel syndrome, right upper limb: Secondary | ICD-10-CM

## 2024-03-19 NOTE — Progress Notes (Signed)
 Follow-up Visit   Date: 03/19/2024    Ashley Rogers MRN: [id number] DOB: 1941-04-01    Ashley Rogers is a 83 y.o. right-handed female with hypertension and RLS returning to the clinic for follow-up of right hand paresthesias.  The patient was accompanied to the clinic by self.  IMPRESSION/PLAN: Assessment & Plan Right cervical radiculopathy.  She has transient improvement of neck stiffness, no change in paresthesias.   - MRI cervical spine wo contrast - She is also seeing a chiropractor and I advised to avoid high velocity manipulation/adjustments - She is exploring complementary therapy with acupuncture  2.  Right carpal tunnel syndrome, severe confirmed by nerve testing. Persistent numbness in fingers. - No benefit with using brace - She is not interested in surgery  Further recommendations pending results.   --------------------------------------------- History of present illness: Starting earlier this year, she developed numbness involving the right hand, worse over the fingertips.  Symptoms are constant.  Preceding this, she felt a pop in the shoulder.  She also has achy pain over the base of the right side of her head.  She tried NSAIDs for severe pain, which helped.    UPDATE 08/25/2023:  She is here for EDX of the hands.  She has been doing PT which has alleviated the severe pain she was having at the base of her head.  She continues to have numbness in the right hand.    UPDATE 03/19/2024: Discussed the use of AI scribe software for clinical note transcription with the patient, who gave verbal consent to proceed.  History of Present Illness Ashley Rogers is an 83 year old female who presents with persistent numbness in her fingers and neck pain.  She experiences persistent numbness in her fingers, and although her nerve testing shows severe right carpal tunnel syndrome, she is unsure and questions the diagnosis. The numbness began after an incident while  exercising, which she believes may have involved a nerve issue. Despite undergoing physical therapy, which she found beneficial, the numbness persists. She has also consulted a chiropractor, who inquired about prior x-rays, although none were performed.   She has tried physical therapy exercises which initially helped, and admits that she has not been compliant with exercises at home.  In her social history, she participates in chair yoga at her church, which involves various exercises. She has previously tried acupuncture for back pain, which she found effective, and is considering it again for her current symptoms.   Medications:  Current Outpatient Medications on File Prior to Visit  Medication Sig Dispense Refill   amLODipine (NORVASC) 2.5 MG tablet Take 2.5 mg by mouth daily.     ibuprofen (ADVIL,MOTRIN) 200 MG tablet Take 800 mg by mouth every 6 (six) hours as needed for moderate pain.     Magnesium Citrate (MAGNESIUM GUMMIES PO) Take by mouth.     Multiple Vitamins-Minerals (CENTRUM PO) Take 1 tablet by mouth daily.     olmesartan (BENICAR) 40 MG tablet Take 40 mg by mouth daily.     tiZANidine  (ZANAFLEX ) 2 MG tablet Take 1 tablet (2 mg total) by mouth at bedtime as needed for muscle spasms (neck pain). 30 tablet 4   oxybutynin (DITROPAN-XL) 10 MG 24 hr tablet Take 10 mg by mouth daily. (Patient not taking: Reported on 03/19/2024)     No current facility-administered medications on file prior to visit.    Allergies:  Allergies  Allergen Reactions   Codeine Nausea And Vomiting  Vital Signs:  BP 139/81   Pulse 86   Ht 5' 4 (1.626 m)   Wt 153 lb (69.4 kg)   SpO2 100%   BMI 26.26 kg/m    Neurological Exam: MENTAL STATUS including orientation to time, place, person, recent and remote memory, attention span and concentration, language, and fund of knowledge is normal.  Speech is not dysarthric.  CRANIAL NERVES:   Normal conjugate, extra-ocular eye movements in all directions  of gaze.  No ptosis.  Face is symmetric.   MOTOR:  Motor strength is 5/5 in all extremities, except right ABP is 4/5.  SENSATION:  Temperature is intact throughout.  REFLEXES:  Reflexes are 2+/4 throughout.   COORDINATION/GAIT:  Gait narrow based and stable.   Data: NCS/EMG of the right arm 08/25/2023: Right median neuropathy at or distal to the wrist, consistent with a clinical diagnosis of carpal tunnel syndrome.  Overall, these findings are severe in degree electrically. Chronic C5-6 radiculopathy affecting the right upper extremity, mild.   Thank you for allowing me to participate in patient's care.  If I can answer any additional questions, I would be pleased to do so.    Sincerely,    Baby Gieger K. Tobie, DO

## 2024-03-21 ENCOUNTER — Other Ambulatory Visit (HOSPITAL_BASED_OUTPATIENT_CLINIC_OR_DEPARTMENT_OTHER): Payer: Self-pay | Admitting: Student

## 2024-03-21 DIAGNOSIS — E2839 Other primary ovarian failure: Secondary | ICD-10-CM

## 2024-04-02 ENCOUNTER — Encounter: Payer: Self-pay | Admitting: Neurology

## 2024-04-17 ENCOUNTER — Ambulatory Visit
Admission: RE | Admit: 2024-04-17 | Discharge: 2024-04-17 | Disposition: A | Discharge status: Home / Self Care | Source: Ambulatory Visit | Attending: Neurology | Admitting: Neurology

## 2024-04-17 DIAGNOSIS — M5412 Radiculopathy, cervical region: Secondary | ICD-10-CM

## 2024-05-01 ENCOUNTER — Ambulatory Visit: Payer: Self-pay | Admitting: Neurology

## 2024-05-02 ENCOUNTER — Telehealth: Payer: Self-pay | Admitting: Neurology

## 2024-05-02 NOTE — Telephone Encounter (Signed)
 [facility] Call ID: [id number]  [facility]: [phone]  Caller states that she spoke to someone is office earlier about faxing info to Dr. Myrna. The information was faxed, but now she needs to get her MRI on disc. Please have someone text her and let her know if the disc can be made.
